# Patient Record
Sex: Female | Born: 1990 | Race: White | Hispanic: No | Marital: Married | Smoking: Former smoker
Health system: Southern US, Community
[De-identification: ages and names within clinical notes are randomized; demographics above are authoritative.]

## PROBLEM LIST (undated history)

## (undated) DIAGNOSIS — F988 Other specified behavioral and emotional disorders with onset usually occurring in childhood and adolescence: Secondary | ICD-10-CM

## (undated) DIAGNOSIS — F419 Anxiety disorder, unspecified: Secondary | ICD-10-CM

## (undated) DIAGNOSIS — O039 Complete or unspecified spontaneous abortion without complication: Secondary | ICD-10-CM

## (undated) DIAGNOSIS — Z8759 Personal history of other complications of pregnancy, childbirth and the puerperium: Secondary | ICD-10-CM

## (undated) HISTORY — PX: BREAST ENHANCEMENT SURGERY: SHX7

## (undated) HISTORY — DX: Other specified behavioral and emotional disorders with onset usually occurring in childhood and adolescence: F98.8

## (undated) HISTORY — DX: Complete or unspecified spontaneous abortion without complication: O03.9

## (undated) HISTORY — PX: WISDOM TOOTH EXTRACTION: SHX21

---
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
## 2013-01-20 DIAGNOSIS — F988 Other specified behavioral and emotional disorders with onset usually occurring in childhood and adolescence: Secondary | ICD-10-CM | POA: Insufficient documentation

## 2013-09-18 DIAGNOSIS — M21612 Bunion of left foot: Secondary | ICD-10-CM

## 2013-09-18 DIAGNOSIS — M21611 Bunion of right foot: Secondary | ICD-10-CM | POA: Insufficient documentation

## 2013-09-18 DIAGNOSIS — Z720 Tobacco use: Secondary | ICD-10-CM | POA: Insufficient documentation

## 2013-12-21 DIAGNOSIS — Z8041 Family history of malignant neoplasm of ovary: Secondary | ICD-10-CM | POA: Insufficient documentation

## 2014-03-09 DIAGNOSIS — F32A Depression, unspecified: Secondary | ICD-10-CM | POA: Insufficient documentation

## 2014-03-09 DIAGNOSIS — F419 Anxiety disorder, unspecified: Secondary | ICD-10-CM

## 2014-03-09 DIAGNOSIS — F329 Major depressive disorder, single episode, unspecified: Secondary | ICD-10-CM | POA: Insufficient documentation

## 2016-02-29 ENCOUNTER — Ambulatory Visit (INDEPENDENT_AMBULATORY_CARE_PROVIDER_SITE_OTHER): Payer: 59 | Admitting: Family Medicine

## 2016-02-29 ENCOUNTER — Encounter: Payer: Self-pay | Admitting: Family Medicine

## 2016-02-29 DIAGNOSIS — F988 Other specified behavioral and emotional disorders with onset usually occurring in childhood and adolescence: Secondary | ICD-10-CM

## 2016-02-29 MED ORDER — AMPHETAMINE-DEXTROAMPHET ER 15 MG PO CP24: 15.0000 mg | ORAL_CAPSULE | Freq: Every day | ORAL | 0 refills | Status: DC

## 2016-02-29 NOTE — Progress Notes (Signed)
Pre visit review using our clinic review tool, if applicable. No additional management support is needed unless otherwise documented below in the visit note. 

## 2016-02-29 NOTE — Patient Instructions (Signed)
Continue the medication.  You will need to see psychology in the near future before continued refills.  Take care  Dr. Adriana Simasook

## 2016-02-29 NOTE — Progress Notes (Signed)
Subjective:  Patient ID: Sherry GreenSamantha Bina, female    DOB: 08/13/1990  Age: 26 y.o. MRN: [id number]  CC: ADD  HPI Sherry GreenSamantha Voong is a 26 y.o. female presents to the clinic today regarding ADD.  ADD  Patient reports a prior history of ADD.  Diagnosed via PCP.  No prior psychological/psychiatric evaluation.  Reports difficulty in school as a child but no diagnosis or treatment until she was an adult.  She is currently about to run out of insurance, hence needs her medication.  She is currently on Adderall XR and doing well. Focus and concentration is good with this medicaiton.  No reported side effects.  No other complaints or issues at this time.  PMH, Surgical Hx, Family Hx, Social History reviewed and updated as below.  Past Medical History:  Diagnosis Date  . ADD (attention deficit disorder)    Past Surgical History:  Procedure Laterality Date  . BREAST ENHANCEMENT SURGERY    . WISDOM TOOTH EXTRACTION     Family History  Problem Relation Age of Onset  . Ovarian cancer Mother    Social History  Substance Use Topics  . Smoking status: Current Some Day Smoker    Types: E-cigarettes  . Smokeless tobacco: Never Used  . Alcohol use 1.2 - 1.8 oz/week    2 - 3 Standard drinks or equivalent per week   Review of Systems  Psychiatric/Behavioral: Positive for decreased concentration.  All other systems reviewed and are negative.  Objective:   Today's Vitals: BP 120/84   Pulse 92   Temp 98.3 F (36.8 C) (Oral)   Ht 5\' 2"  (1.575 m)   Wt 125 lb 6.4 oz (56.9 kg)   SpO2 100%   BMI 22.94 kg/m   Physical Exam  Constitutional: She is oriented to person, place, and time. She appears well-developed and well-nourished. No distress.  HENT:  Head: Normocephalic and atraumatic.  Nose: Nose normal.  Mouth/Throat: Oropharynx is clear and moist. No oropharyngeal exudate.  Normal TM's bilaterally.   Eyes: Conjunctivae are normal. No scleral icterus.  Neck: Neck supple.    Cardiovascular: Normal rate and regular rhythm.   No murmur heard. Pulmonary/Chest: Effort normal and breath sounds normal. She has no wheezes. She has no rales.  Abdominal: Soft. She exhibits no distension. There is no tenderness. There is no rebound and no guarding.  Musculoskeletal: Normal range of motion. She exhibits no edema.  Lymphadenopathy:    She has no cervical adenopathy.  Neurological: She is alert and oriented to person, place, and time.  Skin: Skin is warm and dry. No rash noted.  Psychiatric: She has a normal mood and affect.  Vitals reviewed.  Assessment & Plan:   Problem List Items Addressed This Visit    ADD (attention deficit disorder)    New problem (to me). Losing insurance this coming week. Has been stable on medication. No aberrancies/issues via [place] controlled substance database. Informed patient that I will continue in the short term until she can have a formal psychological eval to confirm. Patient in agreement. Adderall XR refilled today.         Outpatient Encounter Prescriptions as of 02/29/2016  Medication Sig  . amphetamine-dextroamphetamine (ADDERALL XR) 15 MG 24 hr capsule Take 1 capsule by mouth daily.  . [DISCONTINUED] amphetamine-dextroamphetamine (ADDERALL XR) 15 MG 24 hr capsule Take by mouth.   No facility-administered encounter medications on file as of 02/29/2016.     Follow-up: Pending insurance  Aaliyah Gavel [facility] DO [facility]  [employer]

## 2016-02-29 NOTE — Assessment & Plan Note (Signed)
New problem (to me). Losing insurance this coming week. Has been stable on medication. No aberrancies/issues via [place] controlled substance database. Informed patient that I will continue in the short term until she can have a formal psychological eval to confirm. Patient in agreement. Adderall XR refilled today.

## 2017-04-03 ENCOUNTER — Ambulatory Visit (INDEPENDENT_AMBULATORY_CARE_PROVIDER_SITE_OTHER): Payer: Medicaid Other

## 2017-04-03 ENCOUNTER — Ambulatory Visit: Payer: Medicaid Other | Admitting: Podiatry

## 2017-04-03 ENCOUNTER — Encounter: Payer: Self-pay | Admitting: Podiatry

## 2017-04-03 DIAGNOSIS — M216X1 Other acquired deformities of right foot: Secondary | ICD-10-CM | POA: Diagnosis not present

## 2017-04-03 DIAGNOSIS — M21619 Bunion of unspecified foot: Secondary | ICD-10-CM

## 2017-04-09 NOTE — Progress Notes (Signed)
   Subjective: 27 year old female presenting today as a new patient with a chief complaint of pain to the bilateral feet secondary to bunions that have been present for several years. She states the right foot is worse than the left. Wearing shoes exacerbates the pain. She has bot done anything for treatment. Patient is here for further evaluation and treatment.   Past Medical History:  Diagnosis Date  . ADD (attention deficit disorder)       Objective: Physical Exam General: The patient is alert and oriented x3 in no acute distress.  Dermatology: Skin is cool, dry and supple bilateral lower extremities. Negative for open lesions or macerations.  Vascular: Palpable pedal pulses bilaterally. No edema or erythema noted. Capillary refill within normal limits.  Neurological: Epicritic and protective threshold grossly intact bilaterally.   Musculoskeletal Exam: Clinical evidence of bunion deformity noted to the respective foot. There is a moderate pain on palpation range of motion of the first MPJ. Lateral deviation of the hallux noted consistent with hallux abductovalgus.  Radiographic Exam: Increased intermetatarsal angle greater than 15 with a hallux abductus angle greater than 30 noted on AP view. Moderate degenerative changes noted within the first MPJ.  Assessment: 1. HAV w/ bunion deformity bilateral     Plan of Care:  1. Patient was evaluated. X-Rays reviewed. 2. Today we discussed the conservative versus surgical management of bunion deformity. 3. Patient opts for conservative treatment at this time. She is getting married in October.  4. Recommended wide fitting shoes.  5. Return to clinic as needed when ready for surgery.   Has horses.    Felecia ShellingBrent M. Tanay Massiah, DPM [facility]  Dr. Felecia ShellingBrent M. Sharlena Kristensen, DPM    [address]                                        [place], [place] [address]                Office [phone]  Fax [phone]

## 2017-07-31 ENCOUNTER — Encounter: Payer: Self-pay | Admitting: Obstetrics and Gynecology

## 2017-07-31 ENCOUNTER — Other Ambulatory Visit: Payer: 59

## 2017-07-31 ENCOUNTER — Ambulatory Visit (INDEPENDENT_AMBULATORY_CARE_PROVIDER_SITE_OTHER): Payer: 59 | Admitting: Obstetrics and Gynecology

## 2017-07-31 VITALS — BP 124/80 | HR 89 | Ht 61.5 in | Wt 128.0 lb

## 2017-07-31 DIAGNOSIS — N938 Other specified abnormal uterine and vaginal bleeding: Secondary | ICD-10-CM

## 2017-07-31 DIAGNOSIS — Z30011 Encounter for initial prescription of contraceptive pills: Secondary | ICD-10-CM

## 2017-07-31 DIAGNOSIS — Z8759 Personal history of other complications of pregnancy, childbirth and the puerperium: Secondary | ICD-10-CM | POA: Diagnosis not present

## 2017-07-31 NOTE — Progress Notes (Signed)
Tommie Sams, DO   Chief Complaint  Patient presents with  . Menstrual Problem    X3 months randomly bleeding on and off, RLQ pain (poss endometriosis)    HPI:      Ms. Sherry Reynolds is a 27 y.o. G3P1 who LMP was No LMP recorded. (Menstrual status: Irregular Periods)., presents today for NP eval of irreg bleeding for the past 3 months. Pt's menses are usually monthly, last 5-6 days, no BTB, mild dysmen, improved with NSAIDs. Has RLQ midcycle pain with ovulation. Had u/s in past with "spots on ovaries suggestive of endometriosis". Pt's mom had endometriosis as well. No other pelvic pain usually.   Pt saw PCP for UTI sx about 3 months ago and had a pos UPT at that time. 2 wks later pt started bleeding for 2-3 wks. Since then, bleeding has been on and off, sometimes spotting only. She has random RLQ throbbing pains, usually lasting 20 min but lasted all day a few days ago. She has also had bloating and uterus "feels swollen". She has had a neg UPT since bleeding a couple months ago. No vag, urin, GI sx; no fevers.  She is sex active, not using BC. Had postcoital bleeding a couple wks ago that is unusual for pt. No new partners. Would like to restart OCPs once bleeding eval done. Did them in past without side effects. Had nexplanon but removed early due to irreg bleeding.   Had normal pap with PCP earlier this yr.   Past Medical History:  Diagnosis Date  . ADD (attention deficit disorder)   . Miscarriage    twice    Past Surgical History:  Procedure Laterality Date  . BREAST ENHANCEMENT SURGERY    . WISDOM TOOTH EXTRACTION      Family History  Problem Relation Age of Onset  . Endometriosis Mother     Social History   Socioeconomic History  . Marital status: Single    Spouse name: Not on file  . Number of children: Not on file  . Years of education: Not on file  . Highest education level: Not on file  Occupational History  . Not on file  Social Needs  . Financial  resource strain: Not on file  . Food insecurity:    Worry: Not on file    Inability: Not on file  . Transportation needs:    Medical: Not on file    Non-medical: Not on file  Tobacco Use  . Smoking status: Current Some Day Smoker    Types: E-cigarettes  . Smokeless tobacco: Never Used  Substance and Sexual Activity  . Alcohol use: Yes    Alcohol/week: 1.2 - 1.8 oz    Types: 2 - 3 Standard drinks or equivalent per week  . Drug use: No  . Sexual activity: Yes    Partners: Male    Birth control/protection: None  Lifestyle  . Physical activity:    Days per week: Not on file    Minutes per session: Not on file  . Stress: Not on file  Relationships  . Social connections:    Talks on phone: Not on file    Gets together: Not on file    Attends religious service: Not on file    Active member of club or organization: Not on file    Attends meetings of clubs or organizations: Not on file    Relationship status: Not on file  . Intimate partner violence:    Fear  of current or ex partner: Not on file    Emotionally abused: Not on file    Physically abused: Not on file    Forced sexual activity: Not on file  Other Topics Concern  . Not on file  Social History Narrative  . Not on file    Outpatient Medications Prior to Visit  Medication Sig Dispense Refill  . amphetamine-dextroamphetamine (ADDERALL XR) 15 MG 24 hr capsule Take 1 capsule by mouth daily. 30 capsule 0   No facility-administered medications prior to visit.     ROS:  Review of Systems  Constitutional: Positive for fatigue. Negative for fever and unexpected weight change.  Respiratory: Negative for cough, shortness of breath and wheezing.   Cardiovascular: Negative for chest pain, palpitations and leg swelling.  Gastrointestinal: Negative for blood in stool, constipation, diarrhea, nausea and vomiting.  Endocrine: Negative for cold intolerance, heat intolerance and polyuria.  Genitourinary: Positive for menstrual  problem and pelvic pain. Negative for dyspareunia, dysuria, flank pain, frequency, genital sores, hematuria, urgency, vaginal bleeding, vaginal discharge and vaginal pain.  Musculoskeletal: Negative for back pain, joint swelling and myalgias.  Skin: Negative for rash.  Neurological: Positive for headaches. Negative for dizziness, syncope, light-headedness and numbness.  Hematological: Negative for adenopathy.  Psychiatric/Behavioral: Negative for agitation, confusion, sleep disturbance and suicidal ideas. The patient is not nervous/anxious.   BREAST: No symptoms   OBJECTIVE:   Vitals:  BP 124/80   Pulse 89   Ht 5' 1.5" (1.562 m)   Wt 128 lb (58.1 kg)   BMI 23.79 kg/m   Physical Exam  Constitutional: She is oriented to person, place, and time. Vital signs are normal. She appears well-developed.  Pulmonary/Chest: Effort normal.  Abdominal: Soft. There is tenderness in the right lower quadrant. There is no rigidity and no guarding.  Genitourinary: Vagina normal. There is no rash, tenderness or lesion on the right labia. There is no rash, tenderness or lesion on the left labia. Uterus is not enlarged and not tender. Cervix exhibits no motion tenderness. Right adnexum displays tenderness. Right adnexum displays no mass. Left adnexum displays no mass and no tenderness. No erythema or tenderness in the vagina. No vaginal discharge found.  Musculoskeletal: Normal range of motion.  Neurological: She is alert and oriented to person, place, and time.  Psychiatric: She has a normal mood and affect. Her behavior is normal. Thought content normal.  Vitals reviewed.   Assessment/Plan: DUB (dysfunctional uterine bleeding) - Check serum beta HCG and GYN u/s. Will call with results.  - Plan: US PELVIS TRANSVANGINAL NON-OB (TV ONLY), Beta hCG quant (ref lab)  Miscarriage within last 12 months - Check labs and u/s.   Encounter for initial prescription of contraceptive pills - Will start once DUB sx  eval and managed.     Return today (on 07/31/2017) for GYN u/s at 4:30 today; ABC to call pt.  Alicia B. Copland, PA-C 08/08/2017 9:41 AM

## 2017-07-31 NOTE — Patient Instructions (Signed)
I value your feedback and entrusting us with your care. If you get a [facility] patient survey, I would appreciate you taking the time to let us know about your experience today. Thank you! 

## 2017-08-01 ENCOUNTER — Other Ambulatory Visit: Payer: 59

## 2017-08-01 LAB — BETA HCG QUANT (REF LAB): hCG Quant: <1 m[IU]/mL

## 2017-08-07 ENCOUNTER — Telehealth: Payer: Self-pay | Admitting: Obstetrics and Gynecology

## 2017-08-07 NOTE — Telephone Encounter (Signed)
Spoke with pt since hasn't had u/s yet for DUB. Pt states bleeding stopped last wk and hasn't had any more sx. Will follow cycles and f/u prn.

## 2017-08-08 ENCOUNTER — Encounter: Payer: Self-pay | Admitting: Obstetrics and Gynecology

## 2018-01-22 ENCOUNTER — Other Ambulatory Visit (HOSPITAL_COMMUNITY)
Admission: RE | Admit: 2018-01-22 | Discharge: 2018-01-22 | Disposition: A | Discharge status: Home / Self Care | Payer: Medicaid Other | Source: Ambulatory Visit | Attending: Obstetrics and Gynecology | Admitting: Obstetrics and Gynecology

## 2018-01-22 ENCOUNTER — Encounter: Payer: Self-pay | Admitting: Obstetrics and Gynecology

## 2018-01-22 ENCOUNTER — Ambulatory Visit (INDEPENDENT_AMBULATORY_CARE_PROVIDER_SITE_OTHER): Payer: 59 | Admitting: Obstetrics and Gynecology

## 2018-01-22 VITALS — BP 132/66 | Wt 129.0 lb

## 2018-01-22 DIAGNOSIS — Z3A01 Less than 8 weeks gestation of pregnancy: Secondary | ICD-10-CM

## 2018-01-22 DIAGNOSIS — F17210 Nicotine dependence, cigarettes, uncomplicated: Secondary | ICD-10-CM

## 2018-01-22 DIAGNOSIS — Z113 Encounter for screening for infections with a predominantly sexual mode of transmission: Secondary | ICD-10-CM

## 2018-01-22 DIAGNOSIS — N912 Amenorrhea, unspecified: Secondary | ICD-10-CM

## 2018-01-22 DIAGNOSIS — O99341 Other mental disorders complicating pregnancy, first trimester: Secondary | ICD-10-CM

## 2018-01-22 DIAGNOSIS — O99331 Smoking (tobacco) complicating pregnancy, first trimester: Secondary | ICD-10-CM

## 2018-01-22 DIAGNOSIS — F988 Other specified behavioral and emotional disorders with onset usually occurring in childhood and adolescence: Secondary | ICD-10-CM

## 2018-01-22 DIAGNOSIS — Z720 Tobacco use: Secondary | ICD-10-CM

## 2018-01-22 DIAGNOSIS — F329 Major depressive disorder, single episode, unspecified: Secondary | ICD-10-CM

## 2018-01-22 DIAGNOSIS — Z3480 Encounter for supervision of other normal pregnancy, unspecified trimester: Secondary | ICD-10-CM | POA: Insufficient documentation

## 2018-01-22 DIAGNOSIS — F419 Anxiety disorder, unspecified: Secondary | ICD-10-CM

## 2018-01-22 DIAGNOSIS — Z3201 Encounter for pregnancy test, result positive: Secondary | ICD-10-CM

## 2018-01-22 LAB — POCT URINALYSIS DIPSTICK OB
GLUCOSE, UA: NEGATIVE
POC,PROTEIN,UA: NEGATIVE

## 2018-01-22 LAB — POCT URINE PREGNANCY: Preg Test, Ur: POSITIVE — AB

## 2018-01-22 NOTE — Progress Notes (Signed)
New Obstetric Patient H&P   Chief Complaint: "Desires prenatal care"  History of Present Illness: Patient is a 28 y.o. 204P1021  female, LMP 12/10/2017 presents with amenorrhea and positive home pregnancy test. Based on her  LMP, her EDD is Estimated Date of Delivery: 09/16/18 and her EGA is 60101w1d. Cycles are 5. and 30. days, regular, and occur approximately every : 30 days. Her last pap smear was 10 months ago and was no abnormalities.    She had a urine pregnancy test which was positive 1 week(s)  ago. Her last menstrual period was normal and lasted for  3 or 4 day(s). Since her LMP she claims she has experienced no issues. She had vaginal spotting, ("very very light). Her past medical history is notable for ADHA. Her prior pregnancies are notable for no issues. With her two miscarriages she had them in the first trimester.   Since her LMP, she admits to the use of tobacco products yes. She is using a Vape to help her quit smoking.  She claims she has gained 4 pounds since the start of her pregnancy.  There are cats in the home in the home  no  She admits close contact with children on a regular basis  yes  She has had chicken pox in the past no She has had Tuberculosis exposures, symptoms, or previously tested positive for TB   no Current or past history of domestic violence. no  Genetic Screening/Teratology Counseling: (Includes patient, baby's father, or anyone in either family with:)   1. [facility]'s [facility]/= 3835 [facility]  no 2. Thalassemia ([place], [place], Mediterranean, or Asian background): MCV<80  no 3. Neural tube defect (meningomyelocele, spina bifida, anencephaly)  no 4. Congenital heart defect  no  5. Down syndrome  no 6. Tay-Sachs (Jewish, [place])  no 7. Canavan's Disease  no 8. Sickle cell disease or trait (African)  no  9. Hemophilia or other blood disorders  no  10. Muscular dystrophy  no  11. Cystic fibrosis  no  12. Huntington's Chorea  no  13. Mental  retardation/autism  no 14. Other inherited genetic or chromosomal disorder  no 15. Maternal metabolic disorder (DM, PKU, etc)  no 16. Patient or FOB with a child with a birth defect not listed above no  16a. Patient or FOB with a birth defect themselves no 17. Recurrent pregnancy loss, or stillbirth  no  18. Any medications since LMP other than prenatal vitamins (include vitamins, supplements, OTC meds, drugs, alcohol)  yes 19. Any other genetic/environmental exposure to discuss  no  Infection History:   1. Lives with someone with TB or TB exposed  no  2. Patient or partner has history of genital herpes  no 3. Rash or viral illness since LMP  no 4. History of STI (GC, CT, HPV, syphilis, HIV)  no 5. History of recent travel :  no  Other pertinent information: Has ADHD, taking Adderal    Review of Systems:10 point review of systems negative unless otherwise noted in HPI  Past Medical History:  Diagnosis Date  . ADD (attention deficit disorder)   . Miscarriage    twice   Past Surgical History:  Procedure Laterality Date  . BREAST ENHANCEMENT SURGERY    . WISDOM TOOTH EXTRACTION     Gynecologic History: Patient's last menstrual period was 12/10/2017 (exact date).  Obstetric History: G4P1021, svd x 1, no h/o of D&C  Family History  Problem Relation Age of Onset  . Endometriosis  Mother     Social History   Socioeconomic History  . Marital status: Single    Spouse name: Not on file  . Number of children: Not on file  . Years of education: Not on file  . Highest education level: Not on file  Occupational History  . Not on file  Social Needs  . Financial resource strain: Not on file  . Food insecurity:    Worry: Not on file    Inability: Not on file  . Transportation needs:    Medical: Not on file    Non-medical: Not on file  Tobacco Use  . Smoking status: Current Some Day Smoker    Types: E-cigarettes  . Smokeless tobacco: Never Used  Substance and Sexual  Activity  . Alcohol use: Yes    Alcohol/week: 2.0 - 3.0 standard drinks    Types: 2 - 3 Standard drinks or equivalent per week  . Drug use: No  . Sexual activity: Yes    Partners: Male    Birth control/protection: None  Lifestyle  . Physical activity:    Days per week: Not on file    Minutes per session: Not on file  . Stress: Not on file  Relationships  . Social connections:    Talks on phone: Not on file    Gets together: Not on file    Attends religious service: Not on file    Active member of club or organization: Not on file    Attends meetings of clubs or organizations: Not on file    Relationship status: Not on file  . Intimate partner violence:    Fear of current or ex partner: Not on file    Emotionally abused: Not on file    Physically abused: Not on file    Forced sexual activity: Not on file  Other Topics Concern  . Not on file  Social History Narrative  . Not on file   Allergies: No Known Allergies  Prior to Admission medications   Medication Sig Start Date End Date Taking? Authorizing Provider  amphetamine-dextroamphetamine (ADDERALL XR) 10 MG 24 hr capsule Adderall XR 10 mg capsule,extended release 12/24/17  Yes [provider]   Physical Exam BP 132/66   Wt 129 lb (58.5 kg)   LMP 12/10/2017 (Exact Date)   BMI 23.98 kg/m   Physical Exam Exam conducted with a chaperone present.  Constitutional:      General: She is not in acute distress.    Appearance: Normal appearance.  HENT:     Head: Normocephalic and atraumatic.  Eyes:     General: No scleral icterus.    Conjunctiva/sclera: Conjunctivae normal.  Neck:     Musculoskeletal: Normal range of motion and neck supple.  Cardiovascular:     Rate and Rhythm: Normal rate and regular rhythm.     Heart sounds: No murmur. No friction rub. No gallop.   Pulmonary:     Effort: Pulmonary effort is normal. No respiratory distress.     Breath sounds: Normal breath sounds. No wheezing, rhonchi or  rales.  Abdominal:     General: There is no distension.     Palpations: Abdomen is soft. There is no mass.     Tenderness: There is no abdominal tenderness. There is no guarding or rebound.     Hernia: There is no hernia in the right inguinal area or left inguinal area.  Genitourinary:    General: Normal vulva.     Exam position: Supine.  Pubic Area: No rash.      Labia:        Right: No rash, tenderness or lesion.        Left: No rash, tenderness or lesion.      Urethra: No prolapse or urethral lesion.     Vagina: Normal.     Cervix: Normal.     Uterus: Normal.      Adnexa: Right adnexa normal and left adnexa normal.  Musculoskeletal: Normal range of motion.        General: No swelling or tenderness.  Skin:    General: Skin is warm and dry.     Coloration: Skin is not jaundiced.  Neurological:     General: No focal deficit present.     Mental Status: She is alert and oriented to person, place, and time.     Cranial Nerves: No cranial nerve deficit.  Psychiatric:        Mood and Affect: Mood normal.        Behavior: Behavior normal.        Judgment: Judgment normal.     Female Chaperone present during breast and/or pelvic exam.   Assessment: 28 y.o. [id number] at [redacted]w[redacted]d presenting to initiate prenatal care  Plan: 1) Avoid alcoholic beverages. 2) Patient encouraged not to smoke.  3) Discontinue the use of all non-medicinal drugs and chemicals.  4) Take prenatal vitamins daily.  5) Nutrition, food safety (fish, cheese advisories, and high nitrite foods) and exercise discussed. 6) Hospital and practice style discussed with cross coverage system.  7) Genetic Screening, such as with 1st Trimester Screening, cell free fetal DNA, AFP testing, and Ultrasound, as well as with amniocentesis and CVS as appropriate, is discussed with patient. At the conclusion of today's visit patient undecided genetic testing 8) Patient is asked about travel to areas at risk for the [place] virus, and  counseled to avoid travel and exposure to mosquitoes or sexual partners who may have themselves been exposed to the virus. Testing is discussed, and will be ordered as appropriate.  9) ADHD: discussed possible risks of using Adderall. Recommendation for wean of medication with consideration of starting Wellbutrin.  She voiced that she would like to try come off all medication. 10) Nicotine use:  Recommend she stop vaping and no smoking.    Thomasene Mohair, MD 01/22/2018 2:50 PM

## 2018-01-22 NOTE — Patient Instructions (Signed)
First Trimester of Pregnancy  The first trimester of pregnancy is from week 1 until the end of week 13 (months 1 through 3). A week after a sperm fertilizes an egg, the egg will implant on the wall of the uterus. This embryo will begin to develop into a baby. Genes from you and your partner will form the baby. The female genes will determine whether the baby will be a boy or a girl. At 6-8 weeks, the eyes and face will be formed, and the heartbeat can be seen on ultrasound. At the end of 12 weeks, all the baby's organs will be formed.  Now that you are pregnant, you will want to do everything you can to have a healthy baby. Two of the most important things are to get good prenatal care and to follow your health care provider's instructions. Prenatal care is all the medical care you receive before the baby's birth. This care will help prevent, find, and treat any problems during the pregnancy and childbirth.  Body changes during your first trimester  Your body goes through many changes during pregnancy. The changes vary from woman to woman.   You may gain or lose a couple of pounds at first.   You may feel sick to your stomach (nauseous) and you may throw up (vomit). If the vomiting is uncontrollable, call your health care provider.   You may tire easily.   You may develop headaches that can be relieved by medicines. All medicines should be approved by your health care provider.   You may urinate more often. Painful urination may mean you have a bladder infection.   You may develop heartburn as a result of your pregnancy.   You may develop constipation because certain hormones are causing the muscles that push stool through your intestines to slow down.   You may develop hemorrhoids or swollen veins (varicose veins).   Your breasts may begin to grow larger and become tender. Your nipples may stick out more, and the tissue that surrounds them (areola) may become darker.   Your gums may bleed and may be  sensitive to brushing and flossing.   Dark spots or blotches (chloasma, mask of pregnancy) may develop on your face. This will likely fade after the baby is born.   Your menstrual periods will stop.   You may have a loss of appetite.   You may develop cravings for certain kinds of food.   You may have changes in your emotions from day to day, such as being excited to be pregnant or being concerned that something may go wrong with the pregnancy and baby.   You may have more vivid and strange dreams.   You may have changes in your hair. These can include thickening of your hair, rapid growth, and changes in texture. Some women also have hair loss during or after pregnancy, or hair that feels dry or thin. Your hair will most likely return to normal after your baby is born.  What to expect at prenatal visits  During a routine prenatal visit:   You will be weighed to make sure you and the baby are growing normally.   Your blood pressure will be taken.   Your abdomen will be measured to track your baby's growth.   The fetal heartbeat will be listened to between weeks 10 and 14 of your pregnancy.   Test results from any previous visits will be discussed.  Your health care provider may ask you:     How you are feeling.   If you are feeling the baby move.   If you have had any abnormal symptoms, such as leaking fluid, bleeding, severe headaches, or abdominal cramping.   If you are using any tobacco products, including cigarettes, chewing tobacco, and electronic cigarettes.   If you have any questions.  Other tests that may be performed during your first trimester include:   Blood tests to find your blood type and to check for the presence of any previous infections. The tests will also be used to check for low iron levels (anemia) and protein on red blood cells (Rh antibodies). Depending on your risk factors, or if you previously had diabetes during pregnancy, you may have tests to check for high blood sugar  that affects pregnant women (gestational diabetes).   Urine tests to check for infections, diabetes, or protein in the urine.   An ultrasound to confirm the proper growth and development of the baby.   Fetal screens for spinal cord problems (spina bifida) and Down syndrome.   HIV (human immunodeficiency virus) testing. Routine prenatal testing includes screening for HIV, unless you choose not to have this test.   You may need other tests to make sure you and the baby are doing well.  Follow these instructions at home:  Medicines   Follow your health care provider's instructions regarding medicine use. Specific medicines may be either safe or unsafe to take during pregnancy.   Take a prenatal vitamin that contains at least 600 micrograms (mcg) of folic acid.   If you develop constipation, try taking a stool softener if your health care provider approves.  Eating and drinking     Eat a balanced diet that includes fresh fruits and vegetables, whole grains, good sources of protein such as meat, eggs, or tofu, and low-fat dairy. Your health care provider will help you determine the amount of weight gain that is right for you.   Avoid raw meat and uncooked cheese. These carry germs that can cause birth defects in the baby.   Eating four or five small meals rather than three large meals a day may help relieve nausea and vomiting. If you start to feel nauseous, eating a few soda crackers can be helpful. Drinking liquids between meals, instead of during meals, also seems to help ease nausea and vomiting.   Limit foods that are high in fat and processed sugars, such as fried and sweet foods.   To prevent constipation:  ? Eat foods that are high in fiber, such as fresh fruits and vegetables, whole grains, and beans.  ? Drink enough fluid to keep your urine clear or pale yellow.  Activity   Exercise only as directed by your health care provider. Most women can continue their usual exercise routine during  pregnancy. Try to exercise for 30 minutes at least 5 days a week. Exercising will help you:  ? Control your weight.  ? Stay in shape.  ? Be prepared for labor and delivery.   Experiencing pain or cramping in the lower abdomen or lower back is a good sign that you should stop exercising. Check with your health care provider before continuing with normal exercises.   Try to avoid standing for long periods of time. Move your legs often if you must stand in one place for a long time.   Avoid heavy lifting.   Wear low-heeled shoes and practice good posture.   You may continue to have sex unless your health care   provider tells you not to.  Relieving pain and discomfort   Wear a good support bra to relieve breast tenderness.   Take warm sitz baths to soothe any pain or discomfort caused by hemorrhoids. Use hemorrhoid cream if your health care provider approves.   Rest with your legs elevated if you have leg cramps or low back pain.   If you develop varicose veins in your legs, wear support hose. Elevate your feet for 15 minutes, 3-4 times a day. Limit salt in your diet.  Prenatal care   Schedule your prenatal visits by the twelfth week of pregnancy. They are usually scheduled monthly at first, then more often in the last 2 months before delivery.   Write down your questions. Take them to your prenatal visits.   Keep all your prenatal visits as told by your health care provider. This is important.  Safety   Wear your seat belt at all times when driving.   Make a list of emergency phone numbers, including numbers for family, friends, the hospital, and police and fire departments.  General instructions   Ask your health care provider for a referral to a local prenatal education class. Begin classes no later than the beginning of month 6 of your pregnancy.   Ask for help if you have counseling or nutritional needs during pregnancy. Your health care provider can offer advice or refer you to specialists for help  with various needs.   Do not use hot tubs, steam rooms, or saunas.   Do not douche or use tampons or scented sanitary pads.   Do not cross your legs for long periods of time.   Avoid cat litter boxes and soil used by cats. These carry germs that can cause birth defects in the baby and possibly loss of the fetus by miscarriage or stillbirth.   Avoid all smoking, herbs, alcohol, and medicines not prescribed by your health care provider. Chemicals in these products affect the formation and growth of the baby.   Do not use any products that contain nicotine or tobacco, such as cigarettes and e-cigarettes. If you need help quitting, ask your health care provider. You may receive counseling support and other resources to help you quit.   Schedule a dentist appointment. At home, brush your teeth with a soft toothbrush and be gentle when you floss.  Contact a health care provider if:   You have dizziness.   You have mild pelvic cramps, pelvic pressure, or nagging pain in the abdominal area.   You have persistent nausea, vomiting, or diarrhea.   You have a bad smelling vaginal discharge.   You have pain when you urinate.   You notice increased swelling in your face, hands, legs, or ankles.   You are exposed to fifth disease or chickenpox.   You are exposed to German measles (rubella) and have never had it.  Get help right away if:   You have a fever.   You are leaking fluid from your vagina.   You have spotting or bleeding from your vagina.   You have severe abdominal cramping or pain.   You have rapid weight gain or loss.   You vomit blood or material that looks like coffee grounds.   You develop a severe headache.   You have shortness of breath.   You have any kind of trauma, such as from a fall or a car accident.  Summary   The first trimester of pregnancy is from week 1 until   the end of week 13 (months 1 through 3).   Your body goes through many changes during pregnancy. The changes vary from  woman to woman.   You will have routine prenatal visits. During those visits, your health care provider will examine you, discuss any test results you may have, and talk with you about how you are feeling.  This information is not intended to replace advice given to you by your health care provider. Make sure you discuss any questions you have with your health care provider.  Document Released: 12/19/2000 Document Revised: 12/07/2015 Document Reviewed: 12/07/2015  Elsevier Interactive Patient Education  2019 [employer]

## 2018-01-23 LAB — URINE DRUG PANEL 7
Amphetamines, Urine: NEGATIVE ng/mL
Barbiturate Quant, Ur: NEGATIVE ng/mL
Benzodiazepine Quant, Ur: NEGATIVE ng/mL
Cannabinoid Quant, Ur: NEGATIVE ng/mL
Cocaine (Metab.): NEGATIVE ng/mL
Opiate Quant, Ur: NEGATIVE ng/mL
PCP Quant, Ur: NEGATIVE ng/mL

## 2018-01-24 ENCOUNTER — Telehealth: Payer: Self-pay

## 2018-01-24 ENCOUNTER — Other Ambulatory Visit: Payer: Self-pay | Admitting: Obstetrics and Gynecology

## 2018-01-24 DIAGNOSIS — Z3480 Encounter for supervision of other normal pregnancy, unspecified trimester: Secondary | ICD-10-CM

## 2018-01-24 DIAGNOSIS — F988 Other specified behavioral and emotional disorders with onset usually occurring in childhood and adolescence: Secondary | ICD-10-CM

## 2018-01-24 LAB — URINE CULTURE: Organism ID, Bacteria: NO GROWTH

## 2018-01-24 LAB — CERVICOVAGINAL ANCILLARY ONLY
Chlamydia: NEGATIVE
Neisseria Gonorrhea: NEGATIVE

## 2018-01-24 MED ORDER — AMPHETAMINE-DEXTROAMPHET ER 5 MG PO CP24: 5.0000 mg | ORAL_CAPSULE | Freq: Every day | ORAL | 0 refills | Status: DC

## 2018-01-24 NOTE — Telephone Encounter (Signed)
Pt called back. SDJ please call her after clinicals today.

## 2018-01-24 NOTE — Telephone Encounter (Signed)
Discussed wean of medication with patient. Plan is to have the patient stop medication altogether. However, she is concerned about stopping at 10 mg with no wean. We discussed that I have little-to-no experience in weaning this medication. However, if her PCP was not willing to provider her with a wean, then I would give her 5 mg for 7-10 days. She runs out of 10 mg medication in 2 days. So, she will need something prior to the weekend.

## 2018-01-24 NOTE — Telephone Encounter (Signed)
Pt calling states is trying to get rx from PCP but PCP won't rx it without seeing her which isn't going to happen at 4:30 Fri pm.  Would SDJ be able to rs med talked about.  Please call.  [phone]

## 2018-01-24 NOTE — Telephone Encounter (Signed)
She is unable to get medication prior to weekend. Plan is to wean down over 10 days. Will send in 10-day rx for wean.

## 2018-01-24 NOTE — Telephone Encounter (Signed)
Discussed wean of medication with patient. Plan is to have the patient stop medication altogether. However, she is concerned about stopping at 10 mg with no wean. We discussed that I have little-to-no experience in weaning this medication. However, if her PCP was not willing to provider her with a wean, then I would give her 5 mg for 7-10 days. She runs out of 10 mg medication in 2 days. So, she will need something prior to the weekend. °

## 2018-01-24 NOTE — Telephone Encounter (Signed)
Pt called her PCP for refill of medication you talked about at her last visit with a lower dose.  Her primary, Dr Jose Persia, wants to discuss pt's medication with you.  Pt's [phone]

## 2018-01-30 ENCOUNTER — Other Ambulatory Visit: Payer: Self-pay | Admitting: Obstetrics and Gynecology

## 2018-01-30 ENCOUNTER — Ambulatory Visit (INDEPENDENT_AMBULATORY_CARE_PROVIDER_SITE_OTHER): Payer: Medicaid Other

## 2018-01-30 ENCOUNTER — Ambulatory Visit (INDEPENDENT_AMBULATORY_CARE_PROVIDER_SITE_OTHER): Payer: Self-pay | Admitting: Obstetrics and Gynecology

## 2018-01-30 ENCOUNTER — Encounter: Payer: Self-pay | Admitting: Obstetrics and Gynecology

## 2018-01-30 VITALS — BP 128/74 | Wt 130.0 lb

## 2018-01-30 DIAGNOSIS — Z3480 Encounter for supervision of other normal pregnancy, unspecified trimester: Secondary | ICD-10-CM

## 2018-01-30 DIAGNOSIS — F32A Depression, unspecified: Secondary | ICD-10-CM

## 2018-01-30 DIAGNOSIS — Z3687 Encounter for antenatal screening for uncertain dates: Secondary | ICD-10-CM | POA: Diagnosis not present

## 2018-01-30 DIAGNOSIS — F329 Major depressive disorder, single episode, unspecified: Secondary | ICD-10-CM

## 2018-01-30 DIAGNOSIS — F419 Anxiety disorder, unspecified: Secondary | ICD-10-CM

## 2018-01-30 DIAGNOSIS — Z3A01 Less than 8 weeks gestation of pregnancy: Secondary | ICD-10-CM

## 2018-01-30 DIAGNOSIS — O99341 Other mental disorders complicating pregnancy, first trimester: Secondary | ICD-10-CM

## 2018-01-30 NOTE — Progress Notes (Signed)
Routine Prenatal Care Visit  Subjective  Sherry Reynolds is a 28 y.o. G4P1021 at 2590w2d being seen today for ongoing prenatal care.  She is currently monitored for the following issues for this high-risk pregnancy and has ADD (attention deficit disorder); Anxiety and depression; Bilateral bunions; Family history of ovarian cancer; Tobacco use; Other specified behavioral and emotional disorders with onset usually occurring in childhood and adolescence; and Supervision of other normal pregnancy, antepartum on their problem list.  ----------------------------------------------------------------------------------- Patient reports no complaints.    . Vag. Bleeding: None.   . Denies leaking of fluid.  Dating/viability ultrasound shows single gestational sac with a yolk sac with no fetal pole. No abnormalities noted. The gestational sac measured 9.9 in mean sac diameter, consistent with a 4462w5d gestation. ----------------------------------------------------------------------------------- The following portions of the patient's history were reviewed and updated as appropriate: allergies, current medications, past family history, past medical history, past social history, past surgical history and problem list. Problem list updated.   Objective  Blood pressure 128/74, weight 130 lb (59 kg), last menstrual period 12/10/2017. Pregravid weight 129 lb (58.5 kg) Total Weight Gain 1 lb (0.454 kg) Urinalysis: Urine Protein    Urine Glucose    Fetal Status:           General:  Alert, oriented and cooperative. Patient is in no acute distress.  Skin: Skin is warm and dry. No rash noted.   Cardiovascular: Normal heart rate noted  Respiratory: Normal respiratory effort, no problems with respiration noted  Abdomen: Soft, gravid, appropriate for gestational age.       Pelvic:  Cervical exam deferred        Extremities: Normal range of motion.     Mental Status: Normal mood and affect. Normal behavior. Normal  judgment and thought content.   Imaging Results [place] Ob Transvaginal  Result Date: 01/30/2018 Patient Name: Sherry Reynolds DOB: September 17, 1990 MRN: [id number] ULTRASOUND REPORT Location: [facility] Date of Service: 01/30/2018 Indications:dating Findings: Intrauterine gestational sac measuring 9.439mm, 3362w5d. Yolk sac seen (4.3 mm), but no fetal pole seen due to early gestational age. Right Ovary is normal in appearance. Left Ovary is normal appearance. Survey of the adnexa demonstrates no adnexal masses. There is no free peritoneal fluid in the cul de sac. Impression: 1. Intrauterine gestational sac measuring 3262w5d. This does not correlate with the LMP EDD. 2. Recommendations: 1.Clinical correlation with the patient's History and Physical Exam. 2. Follow up viability in 11 or more days. Darlina Guys* Abby M Clarke, RDMS RVT *Criteria from the Society of Radiologists in Ultrasound Multispecialty Consensus Conference on Early First Trimester Diagnosis of Miscarriage and Exclusion of a Viable Intrauterine Pregnancy, October 2012. The ultrasound images and findings were reviewed by me and I agree with the above report. Thomasene Mohair , MD, Merlinda FrederickFACOG [facility], [facility] 01/30/2018 11:52 AM        Assessment   27 y.o. [id number] at 3190w2d by  09/16/2018, by Last Menstrual Period presenting for routine prenatal visit  Plan   Pregnancy#4 Problems (from 12/10/17 to present)    Problem Noted Resolved   Supervision of other normal pregnancy, antepartum 01/22/2018 by Conard Novak,  D, MD No       Preterm labor symptoms and general obstetric precautions including but not limited to vaginal bleeding, contractions, leaking of fluid and fetal movement were reviewed in detail with the patient. Please refer to After Visit Summary for other counseling recommendations.   Discussed u/s findings.  Likely earlier than thought. But, will repeat ultrasound  in 11+ days to verify viability and get specific dating.  Patient  encouraged to report any abnormal symptoms in the mean time.  Return in about 11 days (around 02/10/2018) for u/s for dating/viability and routine prenatal with Dr. Jean Rosenthal.  Thomasene MohairStephen , MD, Merlinda FrederickFACOG [facility], [facility] 01/30/2018 3:27 PM

## 2018-01-31 ENCOUNTER — Encounter: Payer: 59 | Admitting: Advanced Practice Midwife

## 2018-02-10 ENCOUNTER — Ambulatory Visit (INDEPENDENT_AMBULATORY_CARE_PROVIDER_SITE_OTHER): Payer: Medicaid Other | Admitting: Obstetrics and Gynecology

## 2018-02-10 ENCOUNTER — Other Ambulatory Visit: Payer: Self-pay | Admitting: Obstetrics and Gynecology

## 2018-02-10 ENCOUNTER — Ambulatory Visit (INDEPENDENT_AMBULATORY_CARE_PROVIDER_SITE_OTHER): Payer: Medicaid Other

## 2018-02-10 VITALS — BP 124/74 | Wt 130.0 lb

## 2018-02-10 DIAGNOSIS — Z3480 Encounter for supervision of other normal pregnancy, unspecified trimester: Secondary | ICD-10-CM

## 2018-02-10 DIAGNOSIS — Z3A09 9 weeks gestation of pregnancy: Secondary | ICD-10-CM

## 2018-02-10 DIAGNOSIS — Z3A08 8 weeks gestation of pregnancy: Secondary | ICD-10-CM

## 2018-02-10 DIAGNOSIS — F419 Anxiety disorder, unspecified: Secondary | ICD-10-CM

## 2018-02-10 DIAGNOSIS — Z3A01 Less than 8 weeks gestation of pregnancy: Secondary | ICD-10-CM

## 2018-02-10 DIAGNOSIS — F329 Major depressive disorder, single episode, unspecified: Secondary | ICD-10-CM

## 2018-02-10 DIAGNOSIS — O99341 Other mental disorders complicating pregnancy, first trimester: Secondary | ICD-10-CM

## 2018-02-10 DIAGNOSIS — O3680X0 Pregnancy with inconclusive fetal viability, not applicable or unspecified: Secondary | ICD-10-CM

## 2018-02-10 NOTE — Progress Notes (Deleted)
Routine Prenatal Care Visit  Subjective  Sherry Reynolds is a 28 y.o. G4P1021 at [redacted]w[redacted]d being seen today for ongoing prenatal care.  She is currently monitored for the following issues for this high-risk pregnancy and has ADD (attention deficit disorder); Anxiety and depression; Bilateral bunions; Family history of ovarian cancer; Tobacco use; Other specified behavioral and emotional disorders with onset usually occurring in childhood and adolescence; and Supervision of other normal pregnancy, antepartum on their problem list.  ----------------------------------------------------------------------------------- Patient reports no complaints.    . Vag. Bleeding: None.   . Denies leaking of fluid.  U/S shows no viable intrauterine pregnancy. However, it shows a large sac containing 2 to possibly three smaller sacs.  It does not necessarily have the classic "cluster of grapes" appearance, either.  No abnormal cysts on the ovaries.   ----------------------------------------------------------------------------------- The following portions of the patient's history were reviewed and updated as appropriate: allergies, current medications, past family history, past medical history, past social history, past surgical history and problem list. Problem list updated.   Objective  Blood pressure 124/74, weight 130 lb (59 kg), last menstrual period 12/10/2017. Pregravid weight 129 lb (58.5 kg) Total Weight Gain 1 lb (0.454 kg) Urinalysis: Urine Protein    Urine Glucose    Fetal Status:           General:  Alert, oriented and cooperative. Patient is in no acute distress.  Skin: Skin is warm and dry. No rash noted.   Cardiovascular: Normal heart rate noted  Respiratory: Normal respiratory effort, no problems with respiration noted  Abdomen: Soft, gravid, appropriate for gestational age. Pain/Pressure: Absent     Pelvic:  Cervical exam deferred        Extremities: Normal range of motion.     Mental Status:  Normal mood and affect. Normal behavior. Normal judgment and thought content.   Imaging Results: Us Ob Less Than 14 Weeks With Ob Transvaginal  Result Date: 02/10/2018 ULTRASOUND REPORT Location: [facility] Date of Service: 02/10/2018 Patient Name: Sherry Reynolds DOB: 12/26/1990 MRN: [id number] Indications:dating Findings: Single Gestational sac  without fetal pole is seen FHR: NA Possible one yolk sac and  one amnion without fetal pole (early embryo death) vs two yolk sac (twin pregnancy) vs early stage of pregnancy. Right Ovary is normal in appearance. Left Ovary is normal appearance. Corpus luteal cyst:  is not visualized Survey of the adnexa demonstrates no adnexal masses. There is no free peritoneal fluid in the cul de sac. Impression: 1.Possible one yolk sac and one amnion without fetal pole (early embryo death) vs two yolk sacs (twin pregnancy) vs early stage of pregnancy. Recommendations: 1.Clinical correlation with the patient's History and Physical Exam. 2. Need  follow up ultrasound. Mital bahen P Patel The ultrasound images and findings were reviewed by me and I agree with the above report. Stephen Jackson, MD, FACOG [facility], [facility] 02/10/2018 2:07 PM       Assessment   28 y.o. G4P1021 at [redacted]w[redacted]d by  09/16/2018, by Last Menstrual Period presenting for routine prenatal visit  Plan   Pregnancy#4 Problems (from 12/10/17 to present)    Problem Noted Resolved   Supervision of other normal pregnancy, antepartum 01/22/2018 by Jackson, Stephen D, MD No       Preterm labor symptoms and general obstetric precautions including but not limited to vaginal bleeding, contractions, leaking of fluid and fetal movement were reviewed in detail with the patient. Please refer to After Visit Summary for other counseling recommendations.   -   Discussed ultrasound results.  Technically, this could qualify as an early pregnancy loss given the criteria discussed in ACOG PB 200 ("Early  Pregnancy Loss").  However, given possibly two yolk sacs (?3), I would like to obtain more information to give her a better diagnosis.  Differential does include: failed early pregnancy, multiple gestation (with a possible failure of one or more of the gestations), less likely would be molar pregnancy. Will obtain and trend the beta hCG levels and will get a progesterone level to assess pregnancy status. All this discussed in great detail with the patient.  Discussed that I could not give her a specific diagnosis today. However, I am concerned for an early pregnancy loss. But, we should be absolutely sure before making theses kinds of diagnoses, as this is a very desired pregnancy. She voiced understanding and agreement with the plan.  Follow up will be based on lab results.  Repeat pelvic ultrasound in probably a week to 1.5 weeks.   Return if symptoms worsen or fail to improve.  Stephen Jackson, MD, FACOG [facility], [facility] 02/11/2018 11:20 AM    

## 2018-02-11 ENCOUNTER — Telehealth: Payer: Self-pay | Admitting: Obstetrics and Gynecology

## 2018-02-11 ENCOUNTER — Encounter: Payer: Self-pay | Admitting: Obstetrics and Gynecology

## 2018-02-11 ENCOUNTER — Other Ambulatory Visit: Payer: Self-pay | Admitting: Obstetrics and Gynecology

## 2018-02-11 DIAGNOSIS — Z3480 Encounter for supervision of other normal pregnancy, unspecified trimester: Secondary | ICD-10-CM

## 2018-02-11 LAB — BETA HCG QUANT (REF LAB): hCG Quant: 14254 m[IU]/mL

## 2018-02-11 LAB — PROGESTERONE: Progesterone: 17.7 ng/mL

## 2018-02-11 NOTE — Telephone Encounter (Signed)
Discussed progesterone and hCG levels. Progesterone level is indeterminate.  The hCG level is the first and will trend it again tomorrow. She will call to schedule a lab appointment.  She has no issues today (no bleeding, cramping, etc.).

## 2018-02-12 ENCOUNTER — Other Ambulatory Visit: Payer: Medicaid Other

## 2018-02-12 DIAGNOSIS — Z3480 Encounter for supervision of other normal pregnancy, unspecified trimester: Secondary | ICD-10-CM | POA: Diagnosis not present

## 2018-02-13 ENCOUNTER — Telehealth: Payer: Self-pay | Admitting: Obstetrics and Gynecology

## 2018-02-13 ENCOUNTER — Other Ambulatory Visit: Payer: Self-pay | Admitting: Obstetrics and Gynecology

## 2018-02-13 DIAGNOSIS — Z3A01 Less than 8 weeks gestation of pregnancy: Secondary | ICD-10-CM

## 2018-02-13 DIAGNOSIS — Z3480 Encounter for supervision of other normal pregnancy, unspecified trimester: Secondary | ICD-10-CM

## 2018-02-13 LAB — BETA HCG QUANT (REF LAB): hCG Quant: 12845 m[IU]/mL

## 2018-02-13 NOTE — Progress Notes (Signed)
Routine Prenatal Care Visit  Subjective  Sherry Reynolds is a 28 y.o. G4P1021 at [redacted]w[redacted]d being seen today for ongoing prenatal care.  She is currently monitored for the following issues for this high-risk pregnancy and has ADD (attention deficit disorder); Anxiety and depression; Bilateral bunions; Family history of ovarian cancer; Tobacco use; Other specified behavioral and emotional disorders with onset usually occurring in childhood and adolescence; and Supervision of other normal pregnancy, antepartum on their problem list.  ----------------------------------------------------------------------------------- Patient reports no complaints.    . Vag. Bleeding: None.   . Denies leaking of fluid.  U/S shows no viable intrauterine pregnancy. However, it shows a large sac containing 2 to possibly three smaller sacs.  It does not necessarily have the classic "cluster of grapes" appearance, either.  No abnormal cysts on the ovaries.   ----------------------------------------------------------------------------------- The following portions of the patient's history were reviewed and updated as appropriate: allergies, current medications, past family history, past medical history, past social history, past surgical history and problem list. Problem list updated.   Objective  Blood pressure 124/74, weight 130 lb (59 kg), last menstrual period 12/10/2017. Pregravid weight 129 lb (58.5 kg) Total Weight Gain 1 lb (0.454 kg) Urinalysis: Urine Protein    Urine Glucose    Fetal Status:           General:  Alert, oriented and cooperative. Patient is in no acute distress.  Skin: Skin is warm and dry. No rash noted.   Cardiovascular: Normal heart rate noted  Respiratory: Normal respiratory effort, no problems with respiration noted  Abdomen: Soft, gravid, appropriate for gestational age. Pain/Pressure: Absent     Pelvic:  Cervical exam deferred        Extremities: Normal range of motion.     Mental Status:  Normal mood and affect. Normal behavior. Normal judgment and thought content.   Imaging Results: US Ob Less Than 14 Weeks With Ob Transvaginal  Result Date: 02/10/2018 ULTRASOUND REPORT Location: [facility] Date of Service: 02/10/2018 Patient Name: Phelan Nitsch DOB: [birth year] MRN: [id number] Indications:dating Findings: Single Gestational sac  without fetal pole is seen FHR: NA Possible one yolk sac and  one amnion without fetal pole (early embryo death) vs two yolk sac (twin pregnancy) vs early stage of pregnancy. Right Ovary is normal in appearance. Left Ovary is normal appearance. Corpus luteal cyst:  is not visualized Survey of the adnexa demonstrates no adnexal masses. There is no free peritoneal fluid in the cul de sac. Impression: 1.Possible one yolk sac and one amnion without fetal pole (early embryo death) vs two yolk sacs (twin pregnancy) vs early stage of pregnancy. Recommendations: 1.Clinical correlation with the patient's History and Physical Exam. 2. Need  follow up ultrasound. Mital bahen P Patel The ultrasound images and findings were reviewed by me and I agree with the above report. Thomasene Mohair, MD, [facility], [facility] 02/10/2018 2:07 PM       Assessment   27 y.o. [id number] at [redacted]w[redacted]d by  09/16/2018, by Last Menstrual Period presenting for routine prenatal visit  Plan   Pregnancy#4 Problems (from 12/10/17 to present)    Problem Noted Resolved   Supervision of other normal pregnancy, antepartum 01/22/2018 by Conard Novak, MD No       Preterm labor symptoms and general obstetric precautions including but not limited to vaginal bleeding, contractions, leaking of fluid and fetal movement were reviewed in detail with the patient. Please refer to After Visit Summary for other counseling recommendations.   -  Discussed ultrasound results.  Technically, this could qualify as an early pregnancy loss given the criteria discussed in ACOG PB 200 ("Early  Pregnancy Loss").  However, given possibly two yolk sacs (?3), I would like to obtain more information to give her a better diagnosis.  Differential does include: failed early pregnancy, multiple gestation (with a possible failure of one or more of the gestations), less likely would be molar pregnancy. Will obtain and trend the beta hCG levels and will get a progesterone level to assess pregnancy status. All this discussed in great detail with the patient.  Discussed that I could not give her a specific diagnosis today. However, I am concerned for an early pregnancy loss. But, we should be absolutely sure before making theses kinds of diagnoses, as this is a very desired pregnancy. She voiced understanding and agreement with the plan.  Follow up will be based on lab results.  Repeat pelvic ultrasound in probably a week to 1.5 weeks.   Return if symptoms worsen or fail to improve.  Thomasene MohairStephen Nathen Balaban, MD, [facility]linda FrederickFACOG [facility], [facility] 02/11/2018 11:20 AM

## 2018-02-13 NOTE — Telephone Encounter (Signed)
Discussed small drop in hCG. Disucssed (based on ultrasound findings) that this could represent a failed pregnancy vs a vanishing twin. Plan is to continue trending hCG levels. If they continue to fall, then higher concern for failed pregnancy. If they start to increase, perhaps this would be an indicator of vanishing twin.   Will get an ultrasound next week for follow up.

## 2018-02-14 ENCOUNTER — Other Ambulatory Visit: Payer: Medicaid Other

## 2018-02-14 DIAGNOSIS — Z3480 Encounter for supervision of other normal pregnancy, unspecified trimester: Secondary | ICD-10-CM | POA: Diagnosis not present

## 2018-02-14 DIAGNOSIS — Z3A01 Less than 8 weeks gestation of pregnancy: Secondary | ICD-10-CM | POA: Diagnosis not present

## 2018-02-15 ENCOUNTER — Telehealth: Payer: Self-pay | Admitting: Obstetrics and Gynecology

## 2018-02-15 LAB — BETA HCG QUANT (REF LAB): hCG Quant: 12313 m[IU]/mL

## 2018-02-15 NOTE — Telephone Encounter (Signed)
Discussed slight drop in hCG.  Discussed my suspicion that this was not a normally-progressing pregnancy.  Her preference is to wait for the ultrasound scheduled for 2/12.  Will obtain this u/s and make decisions from there, as indicated.  She has no current symptoms.   Will get ABO/RH next visit.

## 2018-02-19 ENCOUNTER — Other Ambulatory Visit: Payer: Self-pay | Admitting: Obstetrics and Gynecology

## 2018-02-19 ENCOUNTER — Ambulatory Visit (INDEPENDENT_AMBULATORY_CARE_PROVIDER_SITE_OTHER): Payer: Medicaid Other

## 2018-02-19 ENCOUNTER — Encounter: Payer: Self-pay | Admitting: Obstetrics and Gynecology

## 2018-02-19 ENCOUNTER — Ambulatory Visit (INDEPENDENT_AMBULATORY_CARE_PROVIDER_SITE_OTHER): Payer: Medicaid Other | Admitting: Obstetrics and Gynecology

## 2018-02-19 VITALS — BP 110/66 | Wt 130.0 lb

## 2018-02-19 DIAGNOSIS — O3680X0 Pregnancy with inconclusive fetal viability, not applicable or unspecified: Secondary | ICD-10-CM

## 2018-02-19 DIAGNOSIS — Z3480 Encounter for supervision of other normal pregnancy, unspecified trimester: Secondary | ICD-10-CM

## 2018-02-19 DIAGNOSIS — Z3A01 Less than 8 weeks gestation of pregnancy: Secondary | ICD-10-CM | POA: Diagnosis not present

## 2018-02-19 DIAGNOSIS — Z3481 Encounter for supervision of other normal pregnancy, first trimester: Secondary | ICD-10-CM

## 2018-02-19 NOTE — Progress Notes (Signed)
Gynecology Ultrasound Follow Up   Chief Complaint  Patient presents with  . Follow-up    OB Ultrasound  possible anembryonic pregnancy  History of Present Illness: Patient is a 28 y.o. female who presents today for ultrasound evaluation of the above .  Ultrasound demonstrates the following findings Adnexa: no masses seen  Uterus: IUP noted with gestational sac measuring 16.8 mm (~14 mm on last ultrasound 2/3, ~10 mm on 1/23) Additional: three circle-like structures (?yolk sac) with another larger sac with no fetal pole noted.  Over three weeks have passed the original ultrasound showing a gestational sac with a yolk sac and there is still no fetal pole.  Her beta hCG values did trend down slightly, as well.   Past Medical History:  Diagnosis Date  . ADD (attention deficit disorder)   . Miscarriage    twice    Past Surgical History:  Procedure Laterality Date  . BREAST ENHANCEMENT SURGERY    . WISDOM TOOTH EXTRACTION      Family History  Problem Relation Age of Onset  . Endometriosis Mother     Social History   Socioeconomic History  . Marital status: Single    Spouse name: Not on file  . Number of children: Not on file  . Years of education: Not on file  . Highest education level: Not on file  Occupational History  . Not on file  Social Needs  . Financial resource strain: Not on file  . Food insecurity:    Worry: Not on file    Inability: Not on file  . Transportation needs:    Medical: Not on file    Non-medical: Not on file  Tobacco Use  . Smoking status: Current Some Day Smoker    Types: E-cigarettes  . Smokeless tobacco: Never Used  Substance and Sexual Activity  . Alcohol use: Yes    Alcohol/week: 2.0 - 3.0 standard drinks    Types: 2 - 3 Standard drinks or equivalent per week  . Drug use: No  . Sexual activity: Yes    Partners: Male    Birth control/protection: None  Lifestyle  . Physical activity:    Days per week: Not on file    Minutes  per session: Not on file  . Stress: Not on file  Relationships  . Social connections:    Talks on phone: Not on file    Gets together: Not on file    Attends religious service: Not on file    Active member of club or organization: Not on file    Attends meetings of clubs or organizations: Not on file    Relationship status: Not on file  . Intimate partner violence:    Fear of current or ex partner: Not on file    Emotionally abused: Not on file    Physically abused: Not on file    Forced sexual activity: Not on file  Other Topics Concern  . Not on file  Social History Narrative  . Not on file    No Known Allergies  Prior to Admission medications   Medication Sig Start Date End Date Taking? Authorizing Provider  amphetamine-dextroamphetamine (ADDERALL XR) 5 MG 24 hr capsule Take 1 capsule (5 mg total) by mouth daily for 10 days. 01/24/18 02/03/18  Conard Novak, MD    Physical Exam BP 110/66   Wt 130 lb (59 kg)   LMP 12/10/2017 (Exact Date)   BMI 24.17 kg/m    General: NAD HEENT:  normocephalic, anicteric Pulmonary: No increased work of breathing Extremities: no edema, erythema, or tenderness Neurologic: Grossly intact, normal gait Psychiatric: mood appropriate, affect full  Imaging Results US Ob Transvaginal  Result Date: 02/19/2018 Patient Name: Sherry Reynolds DOB: December 18, 1990 MRN: [id number] ULTRASOUND REPORT Location: [facility] Date of Service: 02/19/2018 Indications: Follow up viabiltiy Findings: Intrauterine gestational sac measuring 16.10mm, [redacted]w[redacted]d with three echogenic circles (yolk sac vs other) and one large echogenic circle (questionable empty amnion). No fetal pole seen. Right Ovary is normal in appearance. Left Ovary is normal appearance. Survey of the adnexa demonstrates no adnexal masses. There is no free peritoneal fluid in the cul de sac. Impression: 1. Intrauterine gestational sac measuring 16.42mm, [redacted]w[redacted]d with three echogenic circles (yolk sac vs other) and  one large echogenic circle (questionable empty amnion). No fetal pole seen.  Darlina Guys, RDMS RVT The ultrasound images and findings were reviewed by me and I agree with the above report. Thomasene Mohair, MD, [facility], [facility] 02/19/2018 3:02 PM      Assessment: 28 y.o. [id number]  1. Supervision of other normal pregnancy, antepartum      Plan: Problem List Items Addressed This Visit      Other   Supervision of other normal pregnancy, antepartum - Primary   Relevant Orders   US OB Comp Less 14 Wks     Discussed that ultrasound and lab findings are highly concerning for anembryonic pregnancy.  She would like to be sure. So, will get another ultrasound in 2 weeks to evaluate. Discussed that she could start having symptoms of miscarriage and what the normal amount of bleeding might be. We did start discussing the treatment options for missed abortion.  Though not impossible, it seems highly improbable that this will result in a normal pregnancy.    20 minutes spent in face to face discussion with > 50% spent in counseling,management, and coordination of care of her supervision of pregnancy.   Thomasene Mohair, MD, [facility], [facility] 02/19/2018 4:59 PM

## 2018-03-04 ENCOUNTER — Telehealth: Payer: Self-pay

## 2018-03-04 ENCOUNTER — Encounter: Payer: Medicaid Other | Admitting: Obstetrics and Gynecology

## 2018-03-04 ENCOUNTER — Ambulatory Visit: Payer: Medicaid Other

## 2018-03-04 NOTE — Telephone Encounter (Signed)
Trying to call pt to check on her since she did not show for her u/s today. Please let me know when she calls back and get u/s rescheduled.

## 2018-06-23 ENCOUNTER — Ambulatory Visit (INDEPENDENT_AMBULATORY_CARE_PROVIDER_SITE_OTHER): Payer: Medicaid Other | Admitting: Obstetrics and Gynecology

## 2018-06-23 ENCOUNTER — Encounter: Payer: Self-pay | Admitting: Obstetrics and Gynecology

## 2018-06-23 ENCOUNTER — Other Ambulatory Visit: Payer: Self-pay

## 2018-06-23 ENCOUNTER — Other Ambulatory Visit (HOSPITAL_COMMUNITY)
Admission: RE | Admit: 2018-06-23 | Discharge: 2018-06-23 | Disposition: A | Discharge status: Home / Self Care | Payer: Medicaid Other | Source: Ambulatory Visit | Attending: Obstetrics and Gynecology | Admitting: Obstetrics and Gynecology

## 2018-06-23 VITALS — BP 122/78 | Wt 135.0 lb

## 2018-06-23 DIAGNOSIS — N96 Recurrent pregnancy loss: Secondary | ICD-10-CM

## 2018-06-23 DIAGNOSIS — O99331 Smoking (tobacco) complicating pregnancy, first trimester: Secondary | ICD-10-CM | POA: Diagnosis not present

## 2018-06-23 DIAGNOSIS — Z3480 Encounter for supervision of other normal pregnancy, unspecified trimester: Secondary | ICD-10-CM | POA: Insufficient documentation

## 2018-06-23 DIAGNOSIS — Z3A01 Less than 8 weeks gestation of pregnancy: Secondary | ICD-10-CM

## 2018-06-23 DIAGNOSIS — Z3493 Encounter for supervision of normal pregnancy, unspecified, third trimester: Secondary | ICD-10-CM | POA: Insufficient documentation

## 2018-06-23 DIAGNOSIS — F17210 Nicotine dependence, cigarettes, uncomplicated: Secondary | ICD-10-CM | POA: Diagnosis not present

## 2018-06-23 DIAGNOSIS — Z720 Tobacco use: Secondary | ICD-10-CM

## 2018-06-23 NOTE — Progress Notes (Signed)
New Obstetric Patient H&P   Chief Complaint: "Desires prenatal care"  History of Present Illness: Patient is a 28 y.o. 725P1031  female, LMP 05/05/2018 presents with amenorrhea and positive home pregnancy test. Based on her  LMP, her EDD is Estimated Date of Delivery: 02/09/19 and her EGA is 4872w0d. Cycles are 5. days, regular, and occur approximately every : 30 days. Her last pap smear was 1 years ago and was no abnormalities.    She had a urine pregnancy test which was positive 2 week(s)  ago. Her last menstrual period was normal and lasted for  5 or 6 day(s). Since her LMP she claims she has experienced one episode of mild spotting. Her past medical history is notable for ADHD. She is currently taking no medications for her ADHD . Her prior pregnancies are notable for SAB x 3.  Since her LMP, she admits to the use of tobacco products  Yes. She is trying to quite. She has vaped in the past. She claims she has gained zero pounds since the start of her pregnancy.  There are cats in the home in the home  no  She admits close contact with children on a regular basis  yes  She has had chicken pox in the past no She has had Tuberculosis exposures, symptoms, or previously tested positive for TB   no Current or past history of domestic violence. no  Genetic Screening/Teratology Counseling: (Includes patient, baby's father, or anyone in either family with:)   1. [facility]'s [facility]/= 2[facility]  no 2. Thalassemia ([place], [place], Mediterranean, or Asian background): MCV<80  no 3. Neural tube defect (meningomyelocele, spina bifida, anencephaly)  no 4. Congenital heart defect  no  5. Down syndrome  no 6. Tay-Sachs (Jewish, [place])  no 7. Canavan's Disease  no 8. Sickle cell disease or trait (African)  no  9. Hemophilia or other blood disorders  no  10. Muscular dystrophy  no  11. Cystic fibrosis  no  12. Huntington's Chorea  no  13. Mental retardation/autism  no 14. Other inherited genetic or  chromosomal disorder  no 15. Maternal metabolic disorder (DM, PKU, etc)  no 16. Patient or FOB with a child with a birth defect not listed above no  16a. Patient or FOB with a birth defect themselves no 17. Recurrent pregnancy loss, or stillbirth  no  18. Any medications since LMP other than prenatal vitamins (include vitamins, supplements, OTC meds, drugs, alcohol)  no 19. Any other genetic/environmental exposure to discuss  no  Infection History:   1. Lives with someone with TB or TB exposed  no  2. Patient or partner has history of genital herpes  no 3. Rash or viral illness since LMP  no 4. History of STI (GC, CT, HPV, syphilis, HIV)  no 5. History of recent travel :  no  Other pertinent information:  no    Review of Systems:10 point review of systems negative unless otherwise noted in HPI  Past Medical History:  Diagnosis Date  . ADD (attention deficit disorder)   . Miscarriage    Three times     Past Surgical History:  Procedure Laterality Date  . BREAST ENHANCEMENT SURGERY    . WISDOM TOOTH EXTRACTION      Gynecologic History: Patient's last menstrual period was 05/05/2018.  Obstetric History: [id number], s/p SVD x 1, no h/o of D&C  Family History  Problem Relation Age of Onset  . Endometriosis Mother  Social History   Socioeconomic History  . Marital status: Single    Spouse name: Not on file  . Number of children: Not on file  . Years of education: Not on file  . Highest education level: Not on file  Occupational History  . Not on file  Social Needs  . Financial resource strain: Not on file  . Food insecurity    Worry: Not on file    Inability: Not on file  . Transportation needs    Medical: Not on file    Non-medical: Not on file  Tobacco Use  . Smoking status: Current Some Day Smoker    Types: E-cigarettes  . Smokeless tobacco: Never Used  Substance and Sexual Activity  . Alcohol use: Yes    Alcohol/week: 2.0 - 3.0 standard drinks     Types: 2 - 3 Standard drinks or equivalent per week  . Drug use: No  . Sexual activity: Yes    Partners: Male    Birth control/protection: None  Lifestyle  . Physical activity    Days per week: Not on file    Minutes per session: Not on file  . Stress: Not on file  Relationships  . Social Herbalist on phone: Not on file    Gets together: Not on file    Attends religious service: Not on file    Active member of club or organization: Not on file    Attends meetings of clubs or organizations: Not on file    Relationship status: Not on file  . Intimate partner violence    Fear of current or ex partner: Not on file    Emotionally abused: Not on file    Physically abused: Not on file    Forced sexual activity: Not on file  Other Topics Concern  . Not on file  Social History Narrative  . Not on file    No Known Allergies  Prior to Admission medications: Denies    Physical Exam BP 122/78   Wt 135 lb (61.2 kg)   LMP 05/05/2018   Breastfeeding Unknown   BMI 25.09 kg/m   Physical Exam Exam conducted with a chaperone present.  Constitutional:      General: She is not in acute distress.    Appearance: Normal appearance.  HENT:     Head: Normocephalic and atraumatic.  Eyes:     General: No scleral icterus.    Conjunctiva/sclera: Conjunctivae normal.  Neck:     Musculoskeletal: Neck supple. No neck rigidity.  Cardiovascular:     Rate and Rhythm: Normal rate and regular rhythm.     Heart sounds: No murmur. No friction rub. No gallop.   Pulmonary:     Effort: Pulmonary effort is normal.     Breath sounds: Normal breath sounds. No wheezing, rhonchi or rales.  Abdominal:     General: There is no distension.     Palpations: Abdomen is soft. There is no mass.     Tenderness: There is no abdominal tenderness. There is no guarding.     Hernia: There is no hernia in the left inguinal area or right inguinal area.  Genitourinary:    General: Normal vulva.     Exam  position: Lithotomy position.     Tanner stage (genital): 5.     Labia:        Right: No rash, tenderness, lesion or injury.        Left: No rash, tenderness, lesion or  injury.      Urethra: No prolapse.     Vagina: Normal.     Cervix: Normal.     Uterus: Normal.      Adnexa: Right adnexa normal and left adnexa normal.  Musculoskeletal: Normal range of motion.        General: No swelling, tenderness or deformity.     Right lower leg: No edema.     Left lower leg: No edema.  Lymphadenopathy:     Lower Body: No right inguinal adenopathy. No left inguinal adenopathy.  Skin:    General: Skin is warm and dry.  Neurological:     General: No focal deficit present.     Mental Status: She is alert and oriented to person, place, and time.     Cranial Nerves: No cranial nerve deficit.  Psychiatric:        Mood and Affect: Mood normal.        Behavior: Behavior normal.        Judgment: Judgment normal.    Female Chaperone present during breast and/or pelvic exam.  Bedside pelvic ultrasound shows single, living IUP with cardiac rate of 100bpm.  CRL consistent with EGA of 3341w1d.  This would change her EDD.  Assessment: 28 y.o. [id number] at 5941w1d presenting to initiate prenatal care  Plan: 1) Avoid alcoholic beverages. 2) Patient encouraged not to smoke.  3) Discontinue the use of all non-medicinal drugs and chemicals.  4) Take prenatal vitamins daily.  5) Nutrition, food safety (fish, cheese advisories, and high nitrite foods) and exercise discussed. 6) Hospital and practice style discussed with cross coverage system.  7) Genetic Screening, such as with 1st Trimester Screening, cell free fetal DNA, AFP testing, and Ultrasound, as well as with amniocentesis and CVS as appropriate, is discussed with patient. At the conclusion of today's visit patient undecided genetic testing 8) Patient is asked about travel to areas at risk for the [place] virus, and counseled to avoid travel and exposure to  mosquitoes or sexual partners who may have themselves been exposed to the virus. Testing is discussed, and will be ordered as appropriate.  9) Formal ultrasound later this week to give exact dating and to check that slow heart rate has normalized.   Thomasene MohairStephen Jessica Seidman, MD 06/23/2018 3:00 PM

## 2018-06-24 NOTE — Addendum Note (Signed)
Addended by: Prentice Docker D on: 06/24/2018 06:08 PM   Modules accepted: Orders

## 2018-06-25 DIAGNOSIS — Z3A01 Less than 8 weeks gestation of pregnancy: Secondary | ICD-10-CM | POA: Diagnosis not present

## 2018-06-25 DIAGNOSIS — Z3480 Encounter for supervision of other normal pregnancy, unspecified trimester: Secondary | ICD-10-CM | POA: Diagnosis not present

## 2018-06-25 LAB — CERVICOVAGINAL ANCILLARY ONLY
Chlamydia: NEGATIVE
Neisseria Gonorrhea: NEGATIVE

## 2018-06-27 ENCOUNTER — Other Ambulatory Visit: Payer: Self-pay

## 2018-06-27 ENCOUNTER — Ambulatory Visit (INDEPENDENT_AMBULATORY_CARE_PROVIDER_SITE_OTHER): Payer: Medicaid Other

## 2018-06-27 DIAGNOSIS — O3481 Maternal care for other abnormalities of pelvic organs, first trimester: Secondary | ICD-10-CM | POA: Diagnosis not present

## 2018-06-27 DIAGNOSIS — Z3A01 Less than 8 weeks gestation of pregnancy: Secondary | ICD-10-CM

## 2018-06-27 DIAGNOSIS — N8312 Corpus luteum cyst of left ovary: Secondary | ICD-10-CM | POA: Diagnosis not present

## 2018-06-27 DIAGNOSIS — Z3480 Encounter for supervision of other normal pregnancy, unspecified trimester: Secondary | ICD-10-CM

## 2018-06-27 LAB — URINE CULTURE

## 2018-06-27 LAB — URINE DRUG PANEL 7
Amphetamines, Urine: NEGATIVE ng/mL
Barbiturate Quant, Ur: NEGATIVE ng/mL
Benzodiazepine Quant, Ur: NEGATIVE ng/mL
Cannabinoid Quant, Ur: NEGATIVE ng/mL
Cocaine (Metab.): NEGATIVE ng/mL
Opiate Quant, Ur: NEGATIVE ng/mL
PCP Quant, Ur: NEGATIVE ng/mL

## 2018-06-30 ENCOUNTER — Encounter: Payer: Medicaid Other | Admitting: Obstetrics and Gynecology

## 2018-07-03 ENCOUNTER — Ambulatory Visit (INDEPENDENT_AMBULATORY_CARE_PROVIDER_SITE_OTHER): Payer: Medicaid Other | Admitting: Obstetrics and Gynecology

## 2018-07-03 ENCOUNTER — Other Ambulatory Visit: Payer: Self-pay

## 2018-07-03 VITALS — BP 99/63 | Wt 135.0 lb

## 2018-07-03 DIAGNOSIS — O09291 Supervision of pregnancy with other poor reproductive or obstetric history, first trimester: Secondary | ICD-10-CM

## 2018-07-03 DIAGNOSIS — O99331 Smoking (tobacco) complicating pregnancy, first trimester: Secondary | ICD-10-CM

## 2018-07-03 DIAGNOSIS — Z3A01 Less than 8 weeks gestation of pregnancy: Secondary | ICD-10-CM

## 2018-07-03 DIAGNOSIS — F419 Anxiety disorder, unspecified: Secondary | ICD-10-CM

## 2018-07-03 DIAGNOSIS — Z720 Tobacco use: Secondary | ICD-10-CM

## 2018-07-03 DIAGNOSIS — Z3480 Encounter for supervision of other normal pregnancy, unspecified trimester: Secondary | ICD-10-CM

## 2018-07-03 DIAGNOSIS — N96 Recurrent pregnancy loss: Secondary | ICD-10-CM

## 2018-07-03 DIAGNOSIS — O99341 Other mental disorders complicating pregnancy, first trimester: Secondary | ICD-10-CM

## 2018-07-03 DIAGNOSIS — F329 Major depressive disorder, single episode, unspecified: Secondary | ICD-10-CM

## 2018-07-03 LAB — POCT URINALYSIS DIPSTICK OB
Glucose, UA: NEGATIVE
POC,PROTEIN,UA: NEGATIVE

## 2018-07-03 NOTE — Progress Notes (Signed)
  Routine Prenatal Care Visit  Subjective  Sherry Reynolds is a 28 y.o. G5P1031 at [redacted]w[redacted]d being seen today for ongoing prenatal care.  She is currently monitored for the following issues for this low-risk pregnancy and has ADD (attention deficit disorder); Anxiety and depression; Bilateral bunions; Family history of ovarian cancer; Tobacco use; Other specified behavioral and emotional disorders with onset usually occurring in childhood and adolescence; Supervision of other normal pregnancy, antepartum; and History of recurrent miscarriages on their problem list.  ----------------------------------------------------------------------------------- Patient reports no complaints.    . Vag. Bleeding: None.   . Denies leaking of fluid.  ----------------------------------------------------------------------------------- The following portions of the patient's history were reviewed and updated as appropriate: allergies, current medications, past family history, past medical history, past social history, past surgical history and problem list. Problem list updated.   Objective  Blood pressure 99/63, weight 135 lb (61.2 kg), last menstrual period 05/05/2018, unknown if currently breastfeeding. Pregravid weight 135 lb (61.2 kg) Total Weight Gain 0 lb (0 kg) Urinalysis: Urine Protein Negative  Urine Glucose Negative  Fetal Status: Fetal Heart Rate (bpm): 159         General:  Alert, oriented and cooperative. Patient is in no acute distress.  Skin: Skin is warm and dry. No rash noted.   Cardiovascular: Normal heart rate noted  Respiratory: Normal respiratory effort, no problems with respiration noted  Abdomen: Soft, gravid, appropriate for gestational age.       Pelvic:  Cervical exam deferred        Extremities: Normal range of motion.     Mental Status: Normal mood and affect. Normal behavior. Normal judgment and thought content.   BSUS for embryonic heart rate, interval growth  appropriate.  Assessment   28 y.o. [id number] at [redacted]w[redacted]d by  02/17/2019, by Ultrasound presenting for routine prenatal visit  Plan   Pregnancy 5 Problems (from 06/23/18 to present)    Problem Noted Resolved   Supervision of other normal pregnancy, antepartum 06/23/2018 by Will Bonnet, MD No   History of recurrent miscarriages 06/23/2018 by Will Bonnet, MD No   Anxiety and depression 03/09/2014 by Tomi Bamberger, CMA No     Preterm labor symptoms and general obstetric precautions including but not limited to vaginal bleeding, contractions, leaking of fluid and fetal movement were reviewed in detail with the patient. Please refer to After Visit Summary for other counseling recommendations.   Return in about 1 week (around 07/10/2018) for Routine Prenatal Appointment w Dr. Glennon Mac (may make weekly appointments for several weeks).  Prentice Docker, MD, [facility], [facility] 07/03/2018 5:32 PM

## 2018-07-04 ENCOUNTER — Encounter: Payer: Self-pay | Admitting: Obstetrics and Gynecology

## 2018-07-09 ENCOUNTER — Encounter: Payer: Self-pay | Admitting: Obstetrics and Gynecology

## 2018-07-09 ENCOUNTER — Other Ambulatory Visit: Payer: Self-pay | Admitting: Obstetrics and Gynecology

## 2018-07-09 ENCOUNTER — Other Ambulatory Visit: Payer: Self-pay

## 2018-07-09 ENCOUNTER — Ambulatory Visit (INDEPENDENT_AMBULATORY_CARE_PROVIDER_SITE_OTHER): Payer: Medicaid Other | Admitting: Obstetrics and Gynecology

## 2018-07-09 VITALS — BP 107/67 | Wt 135.0 lb

## 2018-07-09 DIAGNOSIS — F419 Anxiety disorder, unspecified: Secondary | ICD-10-CM

## 2018-07-09 DIAGNOSIS — O99341 Other mental disorders complicating pregnancy, first trimester: Secondary | ICD-10-CM

## 2018-07-09 DIAGNOSIS — Z3A08 8 weeks gestation of pregnancy: Secondary | ICD-10-CM

## 2018-07-09 DIAGNOSIS — Z3480 Encounter for supervision of other normal pregnancy, unspecified trimester: Secondary | ICD-10-CM

## 2018-07-09 DIAGNOSIS — N96 Recurrent pregnancy loss: Secondary | ICD-10-CM

## 2018-07-09 DIAGNOSIS — Z720 Tobacco use: Secondary | ICD-10-CM

## 2018-07-09 DIAGNOSIS — O200 Threatened abortion: Secondary | ICD-10-CM

## 2018-07-09 DIAGNOSIS — O09291 Supervision of pregnancy with other poor reproductive or obstetric history, first trimester: Secondary | ICD-10-CM

## 2018-07-09 DIAGNOSIS — F329 Major depressive disorder, single episode, unspecified: Secondary | ICD-10-CM

## 2018-07-09 LAB — POCT URINALYSIS DIPSTICK OB
Glucose, UA: NEGATIVE
POC,PROTEIN,UA: NEGATIVE

## 2018-07-09 NOTE — Progress Notes (Signed)
  Routine Prenatal Care Visit  Subjective  Sherry Reynolds is a 28 y.o. G5P1031 at [redacted]w[redacted]d being seen today for ongoing prenatal care.  She is currently monitored for the following issues for this low-risk pregnancy and has ADD (attention deficit disorder); Anxiety and depression; Bilateral bunions; Family history of ovarian cancer; Tobacco use; Other specified behavioral and emotional disorders with onset usually occurring in childhood and adolescence; Supervision of other normal pregnancy, antepartum; and History of recurrent miscarriages on their problem list.  ----------------------------------------------------------------------------------- Patient reports no complaints.    . Vag. Bleeding: None.   . Denies leaking of fluid.  ----------------------------------------------------------------------------------- The following portions of the patient's history were reviewed and updated as appropriate: allergies, current medications, past family history, past medical history, past social history, past surgical history and problem list. Problem list updated.   Objective  Blood pressure 107/67, weight 135 lb (61.2 kg), last menstrual period 05/05/2018, unknown if currently breastfeeding. Pregravid weight 135 lb (61.2 kg) Total Weight Gain 0 lb (0 kg) Urinalysis: Urine Protein Negative  Urine Glucose Negative  Fetal Status: Fetal Heart Rate (bpm): absent         General:  Alert, oriented and cooperative. Patient is in no acute distress.  Skin: Skin is warm and dry. No rash noted.   Cardiovascular: Normal heart rate noted  Respiratory: Normal respiratory effort, no problems with respiration noted  Abdomen: Soft, gravid, appropriate for gestational age.       Pelvic:  Cervical exam deferred        Extremities: Normal range of motion.     Mental Status: Normal mood and affect. Normal behavior. Normal judgment and thought content.   Bedside ultrasound (both transabdominally and transvaginally) shows  no embryonic cardiac activity.  CRL consistent with [redacted] week gestation.    Assessment   28 y.o. [id number] at [redacted]w[redacted]d by  02/17/2019, by Ultrasound presenting for routine prenatal visit  Plan   Pregnancy 5 Problems (from 06/23/18 to present)    Problem Noted Resolved   Supervision of other normal pregnancy, antepartum 06/23/2018 by Will Bonnet, MD No   History of recurrent miscarriages 06/23/2018 by Will Bonnet, MD No   Anxiety and depression 03/09/2014 by Tomi Bamberger, CMA No       Preterm labor symptoms and general obstetric precautions including but not limited to vaginal bleeding, contractions, leaking of fluid and fetal movement were reviewed in detail with the patient. Please refer to After Visit Summary for other counseling recommendations.   Discussed likely miscarriage. This has happened for the fourth time. We briefly discussed a workup for recurrent miscarriage.  She would like a formal ultrasound to verify.  One has been ordered given the importance of this diagnosis.  She will obtain an ultrasound as soon as I can get her one and I will call her to go over the results.   I discovered I couldn't find a blood type in the system. Will get tomorrow, as well.   Return in about 1 day (around 07/10/2018) for Schedule pelvic u/s and I'll call with results.  Prentice Docker, MD, [facility], [facility] 07/09/2018 2:41 PM

## 2018-07-10 ENCOUNTER — Other Ambulatory Visit (INDEPENDENT_AMBULATORY_CARE_PROVIDER_SITE_OTHER): Payer: Medicaid Other

## 2018-07-10 ENCOUNTER — Telehealth: Payer: Self-pay | Admitting: Obstetrics and Gynecology

## 2018-07-10 ENCOUNTER — Other Ambulatory Visit: Payer: Medicaid Other

## 2018-07-10 ENCOUNTER — Other Ambulatory Visit: Payer: Self-pay | Admitting: Obstetrics and Gynecology

## 2018-07-10 DIAGNOSIS — Z3A08 8 weeks gestation of pregnancy: Secondary | ICD-10-CM | POA: Diagnosis not present

## 2018-07-10 DIAGNOSIS — Z0189 Encounter for other specified special examinations: Secondary | ICD-10-CM

## 2018-07-10 DIAGNOSIS — O3481 Maternal care for other abnormalities of pelvic organs, first trimester: Secondary | ICD-10-CM

## 2018-07-10 DIAGNOSIS — Z3480 Encounter for supervision of other normal pregnancy, unspecified trimester: Secondary | ICD-10-CM

## 2018-07-10 DIAGNOSIS — N8312 Corpus luteum cyst of left ovary: Secondary | ICD-10-CM

## 2018-07-10 NOTE — Telephone Encounter (Signed)
Discussed u/s findings of no cardiac activity, consistent with missed AB.  Discussed RPL and potential workup. She will consider. Discussed management options for missed AB.  She elects to allow tissue to pass naturally.  She will call no later than 4 weeks, if she has not passed her POC.

## 2018-07-15 LAB — RPR+RH+ABO+RUB AB+AB SCR+CB...
Antibody Screen: NEGATIVE
HIV Screen 4th Generation wRfx: NONREACTIVE
Hematocrit: 36.3 % (ref 34.0–46.6)
Hemoglobin: 12.8 g/dL (ref 11.1–15.9)
Hepatitis B Surface Ag: NEGATIVE
MCH: 31.4 pg (ref 26.6–33.0)
MCHC: 35.3 g/dL (ref 31.5–35.7)
MCV: 89 fL (ref 79–97)
Platelets: 198 10*3/uL (ref 150–450)
RBC: 4.08 x10E6/uL (ref 3.77–5.28)
RDW: 11.6 % — ABNORMAL LOW (ref 11.7–15.4)
RPR Ser Ql: NONREACTIVE
Rh Factor: POSITIVE
Rubella Antibodies, IGG: 1.4 index (ref >0.99)
Varicella zoster IgG: 171 index (ref >165)
WBC: 4.1 10*3/uL (ref 3.4–10.8)

## 2018-07-16 ENCOUNTER — Encounter: Payer: Medicaid Other | Admitting: Obstetrics and Gynecology

## 2018-07-17 ENCOUNTER — Telehealth: Payer: Self-pay

## 2018-07-17 NOTE — Telephone Encounter (Signed)
Called pt to let her know CRS advised her to not come tomorrow and to give it a few weeks to see if it will pass on its own but to let us know if she starts having severe pain or a vaginal odor. Pt is having headaches everyday and extremely tired. Wanted to know if she could have medication to help it pass because she is feeling worse and worse everyday. Would like SDJ to call and speak with her about everything.

## 2018-07-17 NOTE — Telephone Encounter (Signed)
Pt calling; has questions.  [phone]  Left detailed msg SDJ not in office today.  Will be back tomorrow.  To call nurse line c what exactly her questions are.

## 2018-07-17 NOTE — Telephone Encounter (Signed)
Pt returning my call.  Pt was in last week; no FH activity on u/s; miscarried; usually 1-2 days after finding out she miscarried she starts bleeding.  She has not bled any; still has nausea and h/a; has no cramping; had a positive preg test.  Has appt tomorrow c u/s and SDJ.  Should she keep appt for u/s?  [phone]

## 2018-07-17 NOTE — Telephone Encounter (Signed)
See other msg from today.

## 2018-07-18 ENCOUNTER — Encounter: Payer: Medicaid Other | Admitting: Obstetrics and Gynecology

## 2018-07-18 NOTE — Telephone Encounter (Signed)
Tried to call pt to see if she was still coming to appt today. SDJ and I were off yesterday. Please let meknow when she calls back. If she does not come I iwll have SDJ call her back about everything

## 2018-07-18 NOTE — Telephone Encounter (Signed)
Pt coming in today to see SDJ

## 2018-07-25 NOTE — Telephone Encounter (Signed)
Pt calling; wants to talk to SDJ about sxs she's having.  [phone]

## 2018-07-26 NOTE — Telephone Encounter (Signed)
Spoke with patient. She states that she is having symptoms that to her are consistent with ongoing pregnancy. She has a friend who has a doppler and she thought she heard a faint heart tone. We discussed that she has a lot of blood vessels in this area and that the likelihood is that she found her own pulse with the doppler.  She is going to her friend's house today and I advised her to compare her own pulse rate to that of the one she hears. If there is a significant difference, then she might be right.  I did reiterate that I am highly confident in my diagnosis. But, would be glad to be wrong and that it is better to be sure. She can make an appointment for any day I am in clinic next week to have a quick bedside ultrasound to verify.

## 2018-07-30 ENCOUNTER — Encounter: Payer: Self-pay | Admitting: Obstetrics and Gynecology

## 2018-07-30 ENCOUNTER — Ambulatory Visit (INDEPENDENT_AMBULATORY_CARE_PROVIDER_SITE_OTHER): Payer: Medicaid Other | Admitting: Obstetrics and Gynecology

## 2018-07-30 ENCOUNTER — Other Ambulatory Visit: Payer: Self-pay

## 2018-07-30 VITALS — BP 124/78 | Ht 61.0 in | Wt 137.0 lb

## 2018-07-30 DIAGNOSIS — O021 Missed abortion: Secondary | ICD-10-CM

## 2018-07-30 MED ORDER — HYDROCODONE-ACETAMINOPHEN 5-325 MG PO TABS: 1.0000 | ORAL_TABLET | Freq: Four times a day (QID) | ORAL | 0 refills | Status: DC | PRN

## 2018-07-30 MED ORDER — ONDANSETRON 4 MG PO TBDP: 4.0000 mg | ORAL_TABLET | Freq: Four times a day (QID) | ORAL | 0 refills | Status: DC | PRN

## 2018-07-30 MED ORDER — MISOPROSTOL 200 MCG PO TABS: ORAL_TABLET | ORAL | 0 refills | Status: DC

## 2018-07-30 NOTE — Progress Notes (Signed)
Obstetrics & Gynecology Office Visit   Chief Complaint  Patient presents with  . Follow-up  missed abortion  History of Present Illness: 28 y.o. 405P1041 female who presents to verify she has had a missed abortion. She has had worsening symptoms of pregnancy and states that she and her friend were able to find heart tones using a doppler at home.  She has had occasional spotting over the past few days. None today.    Past Medical History:  Diagnosis Date  . ADD (attention deficit disorder)   . Miscarriage    twice    Past Surgical History:  Procedure Laterality Date  . BREAST ENHANCEMENT SURGERY    . WISDOM TOOTH EXTRACTION      Gynecologic History: Patient's last menstrual period was 05/05/2018.  Obstetric History: [id number]  Family History  Problem Relation Age of Onset  . Endometriosis Mother     Social History   Socioeconomic History  . Marital status: Single    Spouse name: Not on file  . Number of children: Not on file  . Years of education: Not on file  . Highest education level: Not on file  Occupational History  . Not on file  Social Needs  . Financial resource strain: Not on file  . Food insecurity    Worry: Not on file    Inability: Not on file  . Transportation needs    Medical: Not on file    Non-medical: Not on file  Tobacco Use  . Smoking status: Current Some Day Smoker    Types: E-cigarettes  . Smokeless tobacco: Never Used  Substance and Sexual Activity  . Alcohol use: Yes    Alcohol/week: 2.0 - 3.0 standard drinks    Types: 2 - 3 Standard drinks or equivalent per week  . Drug use: No  . Sexual activity: Yes    Partners: Male    Birth control/protection: None  Lifestyle  . Physical activity    Days per week: Not on file    Minutes per session: Not on file  . Stress: Not on file  Relationships  . Social Musicianconnections    Talks on phone: Not on file    Gets together: Not on file    Attends religious service: Not on file    Active  member of club or organization: Not on file    Attends meetings of clubs or organizations: Not on file    Relationship status: Not on file  . Intimate partner violence    Fear of current or ex partner: Not on file    Emotionally abused: Not on file    Physically abused: Not on file    Forced sexual activity: Not on file  Other Topics Concern  . Not on file  Social History Narrative  . Not on file    No Known Allergies  Prior to Admission medications   Not on File    Review of Systems  Constitutional: Negative.   HENT: Negative.   Eyes: Negative.   Respiratory: Negative.   Cardiovascular: Negative.   Gastrointestinal: Negative.   Genitourinary: Negative.        See HPI  Musculoskeletal: Negative.   Skin: Negative.   Neurological: Negative.   Psychiatric/Behavioral: Negative.      Physical Exam BP 124/78   Ht 5\' 1"  (1.549 m)   Wt 137 lb (62.1 kg)   LMP 05/05/2018   BMI 25.89 kg/m  Patient's last menstrual period was 05/05/2018. Physical Exam Constitutional:  General: She is not in acute distress.    Appearance: Normal appearance.  HENT:     Head: Normocephalic and atraumatic.  Eyes:     General: No scleral icterus.    Conjunctiva/sclera: Conjunctivae normal.  Neurological:     General: No focal deficit present.     Mental Status: She is alert and oriented to person, place, and time.     Cranial Nerves: No cranial nerve deficit.  Psychiatric:        Mood and Affect: Mood normal.        Behavior: Behavior normal.        Judgment: Judgment normal.   BSUS: CRL 1.98 cm (was 2.0 cm 3 weeks ago - so no change), no cardiac activity   Assessment: 28 y.o. [id number] female here for  1. Missed abortion      Plan: Problem List Items Addressed This Visit    None    Visit Diagnoses    Missed abortion    -  Primary   Relevant Medications   misoprostol (CYTOTEC) 200 MCG tablet   ondansetron (ZOFRAN ODT) 4 MG disintegrating tablet   HYDROcodone-acetaminophen  (NORCO) 5-325 MG tablet     Verified missed abortion. Discussed treatment options of ongoing monitoring for up to 6 weeks, medication treatment (which she elected), and surgical management.  Prescription medication sent for misoprostol per the ACOG-recommended protocol along with medication for nausea and and very small amount for pain.  Prentice Docker, MD 07/30/2018 5:09 PM

## 2018-08-20 DIAGNOSIS — F988 Other specified behavioral and emotional disorders with onset usually occurring in childhood and adolescence: Secondary | ICD-10-CM | POA: Diagnosis not present

## 2018-12-26 ENCOUNTER — Telehealth: Payer: Self-pay | Admitting: Obstetrics and Gynecology

## 2018-12-26 ENCOUNTER — Other Ambulatory Visit: Payer: Self-pay | Admitting: Obstetrics and Gynecology

## 2018-12-26 DIAGNOSIS — R002 Palpitations: Secondary | ICD-10-CM

## 2018-12-26 DIAGNOSIS — Z3480 Encounter for supervision of other normal pregnancy, unspecified trimester: Secondary | ICD-10-CM

## 2018-12-26 NOTE — Telephone Encounter (Signed)
Has had episodes of headache, feeling like her heartbeat was fast, and she had a blood pressure on a home cuff of 138/89.  Is wondering if this is normal.   Last night she had a bad headache and for a while she felt like her heart was beating out of her chest. She didn't have a machine to check her BP. She also felt dizzy. She placed an ice pack on her chest.  And after 10-15 minutes her heart beat slowed down.  She states her LMP was 10/26, so her GA is [redacted]w[redacted]d.   Will get hCG, CBC, CMP, TSH/FT4 to assess other causes (molar pregnancy, anemia, thyroid issues, etc).

## 2018-12-29 ENCOUNTER — Other Ambulatory Visit: Payer: Self-pay

## 2018-12-29 ENCOUNTER — Other Ambulatory Visit: Payer: Medicaid Other

## 2018-12-29 DIAGNOSIS — Z3480 Encounter for supervision of other normal pregnancy, unspecified trimester: Secondary | ICD-10-CM | POA: Diagnosis not present

## 2018-12-29 DIAGNOSIS — R002 Palpitations: Secondary | ICD-10-CM | POA: Diagnosis not present

## 2018-12-30 LAB — COMPREHENSIVE METABOLIC PANEL
ALT: 13 IU/L (ref 0–32)
AST: 17 IU/L (ref 0–40)
Albumin/Globulin Ratio: 2.2 (ref 1.2–2.2)
Albumin: 4.4 g/dL (ref 3.9–5.0)
Alkaline Phosphatase: 43 IU/L (ref 39–117)
BUN/Creatinine Ratio: 18 (ref 9–23)
BUN: 9 mg/dL (ref 6–20)
Bilirubin Total: 0.4 mg/dL (ref 0.0–1.2)
CO2: 20 mmol/L (ref 20–29)
Calcium: 9 mg/dL (ref 8.7–10.2)
Chloride: 100 mmol/L (ref 96–106)
Creatinine, Ser: 0.49 mg/dL — ABNORMAL LOW (ref 0.57–1.00)
GFR calc Af Amer: 153 mL/min/{1.73_m2} (ref >59)
GFR calc non Af Amer: 133 mL/min/{1.73_m2} (ref >59)
Globulin, Total: 2 g/dL (ref 1.5–4.5)
Glucose: 85 mg/dL (ref 65–99)
Potassium: 4.2 mmol/L (ref 3.5–5.2)
Sodium: 134 mmol/L (ref 134–144)
Total Protein: 6.4 g/dL (ref 6.0–8.5)

## 2018-12-30 LAB — CBC
Hematocrit: 33.6 % — ABNORMAL LOW (ref 34.0–46.6)
Hemoglobin: 11.5 g/dL (ref 11.1–15.9)
MCH: 31.6 pg (ref 26.6–33.0)
MCHC: 34.2 g/dL (ref 31.5–35.7)
MCV: 92 fL (ref 79–97)
Platelets: 223 10*3/uL (ref 150–450)
RBC: 3.64 x10E6/uL — ABNORMAL LOW (ref 3.77–5.28)
RDW: 12.1 % (ref 11.7–15.4)
WBC: 5 10*3/uL (ref 3.4–10.8)

## 2018-12-30 LAB — BETA HCG QUANT (REF LAB): hCG Quant: 65483 m[IU]/mL

## 2018-12-30 LAB — TSH+FREE T4
Free T4: 0.85 ng/dL (ref 0.82–1.77)
TSH: 2.12 u[IU]/mL (ref 0.450–4.500)

## 2018-12-31 ENCOUNTER — Ambulatory Visit (INDEPENDENT_AMBULATORY_CARE_PROVIDER_SITE_OTHER): Payer: Medicaid Other | Admitting: Obstetrics and Gynecology

## 2018-12-31 ENCOUNTER — Other Ambulatory Visit: Payer: Self-pay

## 2018-12-31 ENCOUNTER — Other Ambulatory Visit (HOSPITAL_COMMUNITY)
Admission: RE | Admit: 2018-12-31 | Discharge: 2018-12-31 | Disposition: A | Discharge status: Home / Self Care | Payer: Medicaid Other | Source: Ambulatory Visit | Attending: Obstetrics and Gynecology | Admitting: Obstetrics and Gynecology

## 2018-12-31 ENCOUNTER — Encounter: Payer: Self-pay | Admitting: Obstetrics and Gynecology

## 2018-12-31 VITALS — BP 118/78 | HR 98 | Wt 127.0 lb

## 2018-12-31 DIAGNOSIS — O09291 Supervision of pregnancy with other poor reproductive or obstetric history, first trimester: Secondary | ICD-10-CM | POA: Diagnosis not present

## 2018-12-31 DIAGNOSIS — Z3480 Encounter for supervision of other normal pregnancy, unspecified trimester: Secondary | ICD-10-CM | POA: Insufficient documentation

## 2018-12-31 DIAGNOSIS — N96 Recurrent pregnancy loss: Secondary | ICD-10-CM

## 2018-12-31 DIAGNOSIS — Z3A08 8 weeks gestation of pregnancy: Secondary | ICD-10-CM | POA: Insufficient documentation

## 2018-12-31 DIAGNOSIS — R002 Palpitations: Secondary | ICD-10-CM | POA: Diagnosis not present

## 2018-12-31 DIAGNOSIS — F988 Other specified behavioral and emotional disorders with onset usually occurring in childhood and adolescence: Secondary | ICD-10-CM

## 2018-12-31 DIAGNOSIS — O219 Vomiting of pregnancy, unspecified: Secondary | ICD-10-CM

## 2018-12-31 MED ORDER — DOXYLAMINE-PYRIDOXINE 10-10 MG PO TBEC: 2.0000 | DELAYED_RELEASE_TABLET | Freq: Every day | ORAL | 5 refills | Status: DC

## 2018-12-31 NOTE — Progress Notes (Signed)
New Obstetric Patient H&P   Chief Complaint: "Desires prenatal care"   History of Present Illness: Patient is a 28 y.o. [id number] Unavailable female, LMP 11/03/2018 presents with amenorrhea and positive home pregnancy test. Based on her  LMP, her EDD is Estimated Date of Delivery: 08/10/19 and her EGA is 8154w2d. Cycles are 5. days, regular, and occur approximately every : 28 days. Her last pap smear was march 2019 and was no abnormalities.    She had a urine pregnancy test which was positive 3 or 4 week(s)  ago. Her last menstrual period was normal and lasted for  5 or 6 day(s). Since her LMP she claims she has experienced racing heart, nausea. She denies vaginal bleeding. Her past medical history is notable for ADHD . Her prior pregnancies are notable for multiple miscarriages.    Since her LMP, she admits to the use of tobacco products  yes She claims she has gained zero pounds since the start of her pregnancy.  There are cats in the home in the home  no  She admits close contact with children on a regular basis  yes  She has had chicken pox in the past no She has had Tuberculosis exposures, symptoms, or previously tested positive for TB   no Current or past history of domestic violence. no  Genetic Screening/Teratology Counseling: (Includes patient, baby's father, or anyone in either family with:)   1. [facility]= 8435 [facility]  no 2. Thalassemia ([place], [place], Mediterranean, or Asian background): MCV<80  no 3. Neural tube defect (meningomyelocele, spina bifida, anencephaly)  no 4. Congenital heart defect  no  5. Down syndrome  no 6. Tay-Sachs (Jewish, [place])  no 7. Canavan's Disease  no 8. Sickle cell disease or trait (African)  no  9. Hemophilia or other blood disorders  no  10. Muscular dystrophy  no  11. Cystic fibrosis  no  12. Huntington's Chorea  no  13. Mental retardation/autism  no 14. Other inherited genetic or chromosomal disorder  no 15. Maternal metabolic  disorder (DM, PKU, etc)  no 16. Patient or FOB with a child with a birth defect not listed above no  16a. Patient or FOB with a birth defect themselves no 17. Recurrent pregnancy loss, or stillbirth  yes  18. Any medications since LMP other than prenatal vitamins (include vitamins, supplements, OTC meds, drugs, alcohol)  Yes, adderall 19. Any other genetic/environmental exposure to discuss  no  Infection History:   1. Lives with someone with TB or TB exposed  no  2. Patient or partner has history of genital herpes  no 3. Rash or viral illness since LMP  no 4. History of STI (GC, CT, HPV, syphilis, HIV)  no 5. History of recent travel :  no  Other pertinent information:  no     Review of Systems:10 point review of systems negative unless otherwise noted in HPI  Past Medical History:  Diagnosis Date  . ADD (attention deficit disorder)   . Miscarriage    twice    Past Surgical History:  Procedure Laterality Date  . BREAST ENHANCEMENT SURGERY    . WISDOM TOOTH EXTRACTION      Gynecologic History: Patient's last menstrual period was 11/03/2018.  Obstetric History: [id number]  Family History  Problem Relation Age of Onset  . Endometriosis Mother     Social History   Socioeconomic History  . Marital status: Single    Spouse name: Not on file  . Number of  children: Not on file  . Years of education: Not on file  . Highest education level: Not on file  Occupational History  . Not on file  Tobacco Use  . Smoking status: Current Some Day Smoker    Packs/day: 0.25    Types: Cigarettes  . Smokeless tobacco: Never Used  Substance and Sexual Activity  . Alcohol use: Not Currently    Alcohol/week: 2.0 - 3.0 standard drinks    Types: 2 - 3 Standard drinks or equivalent per week  . Drug use: No  . Sexual activity: Yes    Partners: Male    Birth control/protection: None  Other Topics Concern  . Not on file  Social History Narrative  . Not on file   Social Determinants  of Health   Financial Resource Strain:   . Difficulty of Paying Living Expenses: Not on file  Food Insecurity:   . Worried About Programme researcher, broadcasting/film/video in the Last Year: Not on file  . Ran Out of Food in the Last Year: Not on file  Transportation Needs:   . Lack of Transportation (Medical): Not on file  . Lack of Transportation (Non-Medical): Not on file  Physical Activity:   . Days of Exercise per Week: Not on file  . Minutes of Exercise per Session: Not on file  Stress:   . Feeling of Stress : Not on file  Social Connections:   . Frequency of Communication with Friends and Family: Not on file  . Frequency of Social Gatherings with Friends and Family: Not on file  . Attends Religious Services: Not on file  . Active Member of Clubs or Organizations: Not on file  . Attends Banker Meetings: Not on file  . Marital Status: Not on file  Intimate Partner Violence:   . Fear of Current or Ex-Partner: Not on file  . Emotionally Abused: Not on file  . Physically Abused: Not on file  . Sexually Abused: Not on file    No Known Allergies  Prior to Admission medications   Medication Sig Start Date End Date Taking? Authorizing Provider  HYDROcodone-acetaminophen (NORCO) 5-325 MG tablet Take 1 tablet by mouth every 6 (six) hours as needed for moderate pain. 07/30/18   Conard Novak, MD  misoprostol (CYTOTEC) 200 MCG tablet Place four tablets vaginally, repeat in 6-hrs if needed 07/30/18   Conard Novak, MD  ondansetron (ZOFRAN ODT) 4 MG disintegrating tablet Take 1 tablet (4 mg total) by mouth every 6 (six) hours as needed for nausea. 07/30/18   Conard Novak, MD    Physical Exam BP 118/78   Pulse 98   Wt 127 lb (57.6 kg)   LMP 11/03/2018   Breastfeeding Unknown   BMI 24.00 kg/m   Physical Exam Exam conducted with a chaperone present.  Constitutional:      General: She is not in acute distress.    Appearance: Normal appearance. She is well-developed.  HENT:      Head: Normocephalic and atraumatic.  Eyes:     General: No scleral icterus.    Conjunctiva/sclera: Conjunctivae normal.  Neck:     Thyroid: No thyromegaly.  Cardiovascular:     Rate and Rhythm: Normal rate and regular rhythm.     Heart sounds: Normal heart sounds. No murmur. No friction rub. No gallop.   Pulmonary:     Effort: Pulmonary effort is normal.     Breath sounds: Normal breath sounds. No wheezing.  Abdominal:  General: There is no distension.     Palpations: Abdomen is soft. There is no mass.     Tenderness: There is no abdominal tenderness. There is no guarding or rebound.     Hernia: No hernia is present. There is no hernia in the left inguinal area.  Genitourinary:    General: Normal vulva.     Exam position: Lithotomy position.     Tanner stage (genital): 5.     Labia:        Right: No rash, tenderness or lesion.        Left: No rash, tenderness or lesion.      Vagina: Normal.     Cervix: Normal.     Uterus: Normal. Enlarged.      Adnexa: Right adnexa normal and left adnexa normal.  Musculoskeletal:        General: Normal range of motion.     Cervical back: Normal range of motion and neck supple.  Skin:    General: Skin is warm and dry.     Findings: No rash.  Neurological:     General: No focal deficit present.     Mental Status: She is alert and oriented to person, place, and time.     Cranial Nerves: No cranial nerve deficit.  Psychiatric:        Mood and Affect: Mood normal.        Behavior: Behavior normal.        Judgment: Judgment normal.     Female Chaperone present during breast and/or pelvic exam.  Bedside ultrasound: confirms single, living intrauterine pregnancy with CRL consistent with gestational age of [redacted]w[redacted]d, which confirms the EDD.  No abnormalities noted. This was a limited ultrasound due to being transabdominal.  Assessment: 28 y.o. T0P5465 at [redacted]w[redacted]d presenting to initiate prenatal care  Plan: 1) Avoid alcoholic beverages. 2)  Patient encouraged not to smoke.  3) Discontinue the use of all non-medicinal drugs and chemicals.  4) Take prenatal vitamins daily.  5) Nutrition, food safety (fish, cheese advisories, and high nitrite foods) and exercise discussed. 6) Hospital and practice style discussed with cross coverage system.  7) Genetic Screening, such as with 1st Trimester Screening, cell free fetal DNA, AFP testing, and Ultrasound, as well as with amniocentesis and CVS as appropriate, is discussed with patient. At the conclusion of today's visit patient undecided genetic testing 8) Patient is asked about travel to areas at risk for the [place] virus, and counseled to avoid travel and exposure to mosquitoes or sexual partners who may have themselves been exposed to the virus. Testing is discussed, and will be ordered as appropriate.   Prentice Docker, MD 12/31/2018 11:17 AM

## 2018-12-31 NOTE — Patient Instructions (Signed)
For nausea (these may be purchased over-the-counter): -Vitamin B6 (pyridoxine):  25 mg three times each day (may buy 100 mg tablet and take twice per day or try to cut into 4 equal pieces and take 1 piece three times each day).  - doxylamine (found in [employer] and other sleep agents that can be bought in the store): take 25 - 50 mg at bedtime.  May take up to 25 mg three time each day.  However, keep in mind that this might make you sleepy.  

## 2019-01-02 LAB — RPR+RH+ABO+RUB AB+AB SCR+CB...
Antibody Screen: NEGATIVE
HIV Screen 4th Generation wRfx: NONREACTIVE
Hematocrit: 36.8 % (ref 34.0–46.6)
Hemoglobin: 12.5 g/dL (ref 11.1–15.9)
Hepatitis B Surface Ag: NEGATIVE
MCH: 31.6 pg (ref 26.6–33.0)
MCHC: 34 g/dL (ref 31.5–35.7)
MCV: 93 fL (ref 79–97)
Platelets: 207 10*3/uL (ref 150–450)
RBC: 3.95 x10E6/uL (ref 3.77–5.28)
RDW: 11.8 % (ref 11.7–15.4)
RPR Ser Ql: NONREACTIVE
Rh Factor: POSITIVE
Rubella Antibodies, IGG: 1.33 index (ref >0.99)
Varicella zoster IgG: 148 index — ABNORMAL LOW (ref >165)
WBC: 5.9 10*3/uL (ref 3.4–10.8)

## 2019-01-02 LAB — TSH+FREE T4
Free T4: 1.02 ng/dL (ref 0.82–1.77)
TSH: 1.49 u[IU]/mL (ref 0.450–4.500)

## 2019-01-05 ENCOUNTER — Other Ambulatory Visit: Payer: Self-pay

## 2019-01-05 ENCOUNTER — Ambulatory Visit (INDEPENDENT_AMBULATORY_CARE_PROVIDER_SITE_OTHER): Payer: Medicaid Other

## 2019-01-05 ENCOUNTER — Encounter: Payer: Self-pay | Admitting: Obstetrics and Gynecology

## 2019-01-05 ENCOUNTER — Ambulatory Visit (INDEPENDENT_AMBULATORY_CARE_PROVIDER_SITE_OTHER): Payer: Medicaid Other | Admitting: Obstetrics and Gynecology

## 2019-01-05 VITALS — BP 116/74 | Wt 129.0 lb

## 2019-01-05 DIAGNOSIS — O3481 Maternal care for other abnormalities of pelvic organs, first trimester: Secondary | ICD-10-CM | POA: Diagnosis not present

## 2019-01-05 DIAGNOSIS — Z3A08 8 weeks gestation of pregnancy: Secondary | ICD-10-CM | POA: Diagnosis not present

## 2019-01-05 DIAGNOSIS — Z3480 Encounter for supervision of other normal pregnancy, unspecified trimester: Secondary | ICD-10-CM

## 2019-01-05 DIAGNOSIS — O09291 Supervision of pregnancy with other poor reproductive or obstetric history, first trimester: Secondary | ICD-10-CM

## 2019-01-05 DIAGNOSIS — N96 Recurrent pregnancy loss: Secondary | ICD-10-CM

## 2019-01-05 DIAGNOSIS — O219 Vomiting of pregnancy, unspecified: Secondary | ICD-10-CM

## 2019-01-05 DIAGNOSIS — Z3A09 9 weeks gestation of pregnancy: Secondary | ICD-10-CM

## 2019-01-05 LAB — CERVICOVAGINAL ANCILLARY ONLY
Chlamydia: NEGATIVE
Comment: NEGATIVE
Comment: NORMAL
Neisseria Gonorrhea: NEGATIVE

## 2019-01-05 NOTE — Progress Notes (Signed)
Routine Prenatal Care Visit  Subjective  Sherry Reynolds is a 28 y.o. [phone] at [redacted]w[redacted]d being seen today for ongoing prenatal care.  She is currently monitored for the following issues for this low-risk pregnancy and has ADD (attention deficit disorder); Anxiety and depression; Bilateral bunions; Family history of ovarian cancer; Tobacco use; Other specified behavioral and emotional disorders with onset usually occurring in childhood and adolescence; Supervision of other normal pregnancy, antepartum; History of recurrent miscarriages; Nausea and vomiting during pregnancy; and Palpitations on their problem list.  ----------------------------------------------------------------------------------- Patient reports no complaints.    .  .   Pincus Large Fluid denies.  U/S confirms EDD ----------------------------------------------------------------------------------- The following portions of the patient's history were reviewed and updated as appropriate: allergies, current medications, past family history, past medical history, past social history, past surgical history and problem list. Problem list updated.  Objective  Blood pressure 116/74, weight 129 lb (58.5 kg), last menstrual period 11/03/2018, unknown if currently breastfeeding. Pregravid weight 127 lb (57.6 kg) Total Weight Gain 2 lb (0.907 kg) Urinalysis: Urine Protein    Urine Glucose    Fetal Status: Fetal Heart Rate (bpm): Present         General:  Alert, oriented and cooperative. Patient is in no acute distress.  Skin: Skin is warm and dry. No rash noted.   Cardiovascular: Normal heart rate noted  Respiratory: Normal respiratory effort, no problems with respiration noted  Abdomen: Soft, gravid, appropriate for gestational age.       Pelvic:  Cervical exam deferred        Extremities: Normal range of motion.     Mental Status: Normal mood and affect. Normal behavior. Normal judgment and thought content.   Imaging Results US OB Comp  Less 14 Wks  Result Date: 01/05/2019 Patient Name: Sherry Reynolds DOB: August 11, 1990 MRN: [id number] ULTRASOUND REPORT Location: [facility] Date of Service: 01/05/2019 Indications:dating Findings: Mason Jim intrauterine pregnancy is visualized with a CRL consistent with [redacted]w[redacted]d gestation, giving an (U/S) EDD of 08/14/2019. The (U/S) EDD is consistent with the clinically established EDD of 08/10/2019. FHR: 173 BPM CRL measurement: 19.1 mm Yolk sac is visualized and appears normal. Amnion: visualized and appears normal Right Ovary is normal in appearance. Left Ovary is normal appearance. Corpus luteal cyst:  Left ovary Survey of the adnexa demonstrates no adnexal masses. There is no free peritoneal fluid in the cul de sac. Impression: 1. [redacted]w[redacted]d Viable Singleton Intrauterine pregnancy by U/S. 2. (U/S) EDD is consistent with Clinically established EDD of 08/10/2019. Deanna Artis, RT There is a viable singleton gestation.  Detailed evaluation of the fetal anatomy is precluded by early gestational age.  It must be noted that a normal ultrasound particular at this early gestational age is unable to rule out fetal aneuploidy, risk of first trimester miscarriage, or anatomic birth defects. Thomasene Mohair, MD, [facility], [facility] 01/05/2019 1:21 PM      Assessment   28 y.o. [id number] at [redacted]w[redacted]d by  08/10/2019, by Last Menstrual Period presenting for routine prenatal visit  Plan   Pregnancy#6 Problems (from 11/03/18 to present)    Problem Noted Resolved   Nausea and vomiting during pregnancy 12/31/2018 by Conard Novak, MD No   Palpitations 12/31/2018 by Conard Novak, MD No   Supervision of other normal pregnancy, antepartum 06/23/2018 by Conard Novak, MD No   History of recurrent miscarriages 06/23/2018 by Conard Novak, MD No   ADD (attention deficit disorder) 02/29/2016 by Everlene Other  G, DO No       Preterm labor symptoms and general obstetric precautions  including but not limited to vaginal bleeding, contractions, leaking of fluid and fetal movement were reviewed in detail with the patient. Please refer to After Visit Summary for other counseling recommendations.   -NIPT next appt  Return in about 1 week (around 01/12/2019) for Routine Prenatal Appointment ([facility]).  Prentice Docker, MD, [facility], [facility] 01/05/2019 1:42 PM

## 2019-01-07 ENCOUNTER — Other Ambulatory Visit: Payer: Self-pay | Admitting: Obstetrics and Gynecology

## 2019-01-07 ENCOUNTER — Telehealth: Payer: Self-pay | Admitting: Obstetrics and Gynecology

## 2019-01-07 DIAGNOSIS — K5900 Constipation, unspecified: Secondary | ICD-10-CM

## 2019-01-07 DIAGNOSIS — Z3480 Encounter for supervision of other normal pregnancy, unspecified trimester: Secondary | ICD-10-CM

## 2019-01-07 LAB — URINE DRUG PANEL 7
Amphetamines, Urine: POSITIVE — AB
Barbiturate Quant, Ur: NEGATIVE ng/mL
Benzodiazepine Quant, Ur: NEGATIVE ng/mL
Cannabinoid Quant, Ur: NEGATIVE ng/mL
Cocaine (Metab.): NEGATIVE ng/mL
Opiate Quant, Ur: NEGATIVE ng/mL
PCP Quant, Ur: NEGATIVE ng/mL

## 2019-01-07 LAB — URINE CULTURE: Organism ID, Bacteria: NO GROWTH

## 2019-01-07 MED ORDER — LACTULOSE 10 G PO PACK: 10.0000 g | PACK | Freq: Three times a day (TID) | ORAL | 1 refills | Status: DC | PRN

## 2019-01-07 NOTE — Telephone Encounter (Signed)
Patient is calling with severe constipation. Patient reports last bowel movement a week ago. Patient says she has tried a enema with no relief. Patient is wanting to know the next step for treatment. Please advise

## 2019-01-07 NOTE — Telephone Encounter (Signed)
I sent in an Rx and sent her a message about it.

## 2019-01-07 NOTE — Telephone Encounter (Signed)
Do you want to send in stool softener?

## 2019-01-09 NOTE — L&D Delivery Note (Signed)
Delivery Note At 11:26 AM a viable female was delivered via Vaginal, Spontaneous (Presentation: Middle Occiput Anterior).  APGAR: 7, 9; weight 7 lb 12.2 oz (3,520 g).   Placenta status: Spontaneous, Intact.  Cord: 3 vessels with the following complications: None.  Cord pH: n/a  Anesthesia: Epidural Episiotomy: None Lacerations: 1st degree Suture Repair: 3.0 vicryl Est. Blood Loss (mL):    Mom to postpartum.  Baby to Couplet care / Skin to Skin.  Called to see patient.  Mom pushed to deliver a viable female infant.  The head followed by shoulders, which delivered without difficulty, and the rest of the body.  No nuchal cord noted.  Baby to mom's chest.  Cord clamped and cut after > 1 min delay.  No cord blood obtained.  Placenta delivered spontaneously, intact, with a 3-vessel cord.  First degree vaginal laceration repaired with 3-0 Vicryl in standard fashion.  All counts correct.  Hemostasis obtained with IV pitocin and fundal massage. EBL 300 mL.     Thomasene Mohair, MD 08/13/2019, 2:54 PM

## 2019-01-15 ENCOUNTER — Other Ambulatory Visit: Payer: Self-pay

## 2019-01-15 ENCOUNTER — Encounter: Payer: Self-pay | Admitting: Obstetrics and Gynecology

## 2019-01-15 ENCOUNTER — Ambulatory Visit (INDEPENDENT_AMBULATORY_CARE_PROVIDER_SITE_OTHER): Payer: Medicaid Other | Admitting: Obstetrics and Gynecology

## 2019-01-15 VITALS — BP 125/73 | Wt 129.0 lb

## 2019-01-15 DIAGNOSIS — Z1379 Encounter for other screening for genetic and chromosomal anomalies: Secondary | ICD-10-CM | POA: Diagnosis not present

## 2019-01-15 DIAGNOSIS — N96 Recurrent pregnancy loss: Secondary | ICD-10-CM

## 2019-01-15 DIAGNOSIS — Z3480 Encounter for supervision of other normal pregnancy, unspecified trimester: Secondary | ICD-10-CM

## 2019-01-15 DIAGNOSIS — F419 Anxiety disorder, unspecified: Secondary | ICD-10-CM

## 2019-01-15 DIAGNOSIS — O219 Vomiting of pregnancy, unspecified: Secondary | ICD-10-CM

## 2019-01-15 DIAGNOSIS — F329 Major depressive disorder, single episode, unspecified: Secondary | ICD-10-CM

## 2019-01-15 DIAGNOSIS — Z3A10 10 weeks gestation of pregnancy: Secondary | ICD-10-CM | POA: Diagnosis not present

## 2019-01-15 LAB — POCT URINALYSIS DIPSTICK OB
Glucose, UA: NEGATIVE
POC,PROTEIN,UA: NEGATIVE

## 2019-01-15 MED ORDER — CITRANATAL ASSURE 35-1 & 300 MG PO MISC: 2.0000 | Freq: Every day | ORAL | 8 refills | Status: DC

## 2019-01-15 NOTE — Progress Notes (Signed)
  Routine Prenatal Care Visit  Subjective  Sherry Reynolds is a 29 y.o. [phone] at [redacted]w[redacted]d being seen today for ongoing prenatal care.  She is currently monitored for the following issues for this low-risk pregnancy and has ADD (attention deficit disorder); Anxiety and depression; Bilateral bunions; Family history of ovarian cancer; Tobacco use; Other specified behavioral and emotional disorders with onset usually occurring in childhood and adolescence; Supervision of other normal pregnancy, antepartum; History of recurrent miscarriages; Nausea and vomiting during pregnancy; and Palpitations on their problem list.  ----------------------------------------------------------------------------------- Patient reports no complaints.    . Vag. Bleeding: None.   . Leaking Fluid denies.  ----------------------------------------------------------------------------------- The following portions of the patient's history were reviewed and updated as appropriate: allergies, current medications, past family history, past medical history, past social history, past surgical history and problem list. Problem list updated.  Objective  Blood pressure 125/73, weight 129 lb (58.5 kg), last menstrual period 11/03/2018, unknown if currently breastfeeding. Pregravid weight 127 lb (57.6 kg) Total Weight Gain 2 lb (0.907 kg) Urinalysis: Urine Protein Negative  Urine Glucose Negative  Fetal Status: Fetal Heart Rate (bpm): 170         General:  Alert, oriented and cooperative. Patient is in no acute distress.  Skin: Skin is warm and dry. No rash noted.   Cardiovascular: Normal heart rate noted  Respiratory: Normal respiratory effort, no problems with respiration noted  Abdomen: Soft, gravid, appropriate for gestational age.       Pelvic:  Cervical exam deferred        Extremities: Normal range of motion.     Mental Status: Normal mood and affect. Normal behavior. Normal judgment and thought content.   Assessment   29  y.o. [id number] at [redacted]w[redacted]d by  08/10/2019, by Last Menstrual Period presenting for routine prenatal visit  Plan   Pregnancy#6 Problems (from 11/03/18 to present)    Problem Noted Resolved   Nausea and vomiting during pregnancy 12/31/2018 by Conard Novak, MD No   Palpitations 12/31/2018 by Conard Novak, MD No   Supervision of other normal pregnancy, antepartum 06/23/2018 by Conard Novak, MD No   History of recurrent miscarriages 06/23/2018 by Conard Novak, MD No   ADD (attention deficit disorder) 02/29/2016 by Tommie Sams, DO No       Preterm labor symptoms and general obstetric precautions including but not limited to vaginal bleeding, contractions, leaking of fluid and fetal movement were reviewed in detail with the patient. Please refer to After Visit Summary for other counseling recommendations.   - NIPT today  Return in about 1 week (around 01/22/2019) for weekly prenatal appointments with Dr. Jean Rosenthal.  Thomasene Mohair, MD, [facility], [facility] 01/15/2019 11:04 AM

## 2019-01-16 ENCOUNTER — Other Ambulatory Visit: Payer: Self-pay | Admitting: Obstetrics and Gynecology

## 2019-01-16 DIAGNOSIS — Z3480 Encounter for supervision of other normal pregnancy, unspecified trimester: Secondary | ICD-10-CM

## 2019-01-16 DIAGNOSIS — Z3A10 10 weeks gestation of pregnancy: Secondary | ICD-10-CM

## 2019-01-21 LAB — MATERNIT21 PLUS CORE+SCA
Fetal Fraction: 8
Monosomy X (Turner Syndrome): NOT DETECTED
Result (T21): NEGATIVE
Trisomy 13 (Patau syndrome): NEGATIVE
Trisomy 18 (Edwards syndrome): NEGATIVE
Trisomy 21 (Down syndrome): NEGATIVE
XXX (Triple X Syndrome): NOT DETECTED
XXY (Klinefelter Syndrome): NOT DETECTED
XYY (Jacobs Syndrome): NOT DETECTED

## 2019-01-23 ENCOUNTER — Ambulatory Visit (INDEPENDENT_AMBULATORY_CARE_PROVIDER_SITE_OTHER): Payer: Medicaid Other | Admitting: Obstetrics and Gynecology

## 2019-01-23 ENCOUNTER — Encounter: Payer: Self-pay | Admitting: Obstetrics and Gynecology

## 2019-01-23 ENCOUNTER — Other Ambulatory Visit: Payer: Self-pay

## 2019-01-23 VITALS — BP 124/70 | Wt 132.0 lb

## 2019-01-23 DIAGNOSIS — F419 Anxiety disorder, unspecified: Secondary | ICD-10-CM

## 2019-01-23 DIAGNOSIS — O219 Vomiting of pregnancy, unspecified: Secondary | ICD-10-CM

## 2019-01-23 DIAGNOSIS — F32A Depression, unspecified: Secondary | ICD-10-CM

## 2019-01-23 DIAGNOSIS — Z3480 Encounter for supervision of other normal pregnancy, unspecified trimester: Secondary | ICD-10-CM

## 2019-01-23 DIAGNOSIS — O99341 Other mental disorders complicating pregnancy, first trimester: Secondary | ICD-10-CM

## 2019-01-23 DIAGNOSIS — F329 Major depressive disorder, single episode, unspecified: Secondary | ICD-10-CM

## 2019-01-23 DIAGNOSIS — Z3A11 11 weeks gestation of pregnancy: Secondary | ICD-10-CM

## 2019-01-23 NOTE — Progress Notes (Signed)
  Routine Prenatal Care Visit  Subjective  Sherry Reynolds is a 29 y.o. [phone] at [redacted]w[redacted]d being seen today for ongoing prenatal care.  She is currently monitored for the following issues for this low-risk pregnancy and has ADD (attention deficit disorder); Anxiety and depression; Bilateral bunions; Family history of ovarian cancer; Tobacco use; Other specified behavioral and emotional disorders with onset usually occurring in childhood and adolescence; Supervision of other normal pregnancy, antepartum; History of recurrent miscarriages; Nausea and vomiting during pregnancy; and Palpitations on their problem list.  ----------------------------------------------------------------------------------- Patient reports no complaints.    . Vag. Bleeding: None.   . Leaking Fluid denies.  ----------------------------------------------------------------------------------- The following portions of the patient's history were reviewed and updated as appropriate: allergies, current medications, past family history, past medical history, past social history, past surgical history and problem list. Problem list updated.  Objective  Blood pressure 124/70, weight 132 lb (59.9 kg), last menstrual period 11/03/2018, unknown if currently breastfeeding. Pregravid weight 127 lb (57.6 kg) Total Weight Gain 5 lb (2.268 kg) Urinalysis: Urine Protein    Urine Glucose    Fetal Status: Fetal Heart Rate (bpm): 170         General:  Alert, oriented and cooperative. Patient is in no acute distress.  Skin: Skin is warm and dry. No rash noted.   Cardiovascular: Normal heart rate noted  Respiratory: Normal respiratory effort, no problems with respiration noted  Abdomen: Soft, gravid, appropriate for gestational age.       Pelvic:  Cervical exam deferred        Extremities: Normal range of motion.     Mental Status: Normal mood and affect. Normal behavior. Normal judgment and thought content.   Assessment   29 y.o. [id number] at  [redacted]w[redacted]d by  08/10/2019, by Last Menstrual Period presenting for routine prenatal visit  Plan   Pregnancy#6 Problems (from 11/03/18 to present)    Problem Noted Resolved   Nausea and vomiting during pregnancy 12/31/2018 by Conard Novak, MD No   Palpitations 12/31/2018 by Conard Novak, MD No   Supervision of other normal pregnancy, antepartum 06/23/2018 by Conard Novak, MD No   History of recurrent miscarriages 06/23/2018 by Conard Novak, MD No   ADD (attention deficit disorder) 02/29/2016 by Tommie Sams, DO No       Preterm labor symptoms and general obstetric precautions including but not limited to vaginal bleeding, contractions, leaking of fluid and fetal movement were reviewed in detail with the patient. Please refer to After Visit Summary for other counseling recommendations.   Return for Continue weekly visits.  Thomasene Mohair, MD, [facility], [facility] 01/23/2019 9:26 AM

## 2019-01-30 ENCOUNTER — Ambulatory Visit (INDEPENDENT_AMBULATORY_CARE_PROVIDER_SITE_OTHER): Payer: Medicaid Other | Admitting: Obstetrics and Gynecology

## 2019-01-30 ENCOUNTER — Other Ambulatory Visit: Payer: Self-pay

## 2019-01-30 ENCOUNTER — Encounter: Payer: Self-pay | Admitting: Obstetrics and Gynecology

## 2019-01-30 VITALS — BP 122/74 | Wt 133.0 lb

## 2019-01-30 DIAGNOSIS — O99331 Smoking (tobacco) complicating pregnancy, first trimester: Secondary | ICD-10-CM

## 2019-01-30 DIAGNOSIS — Z3A12 12 weeks gestation of pregnancy: Secondary | ICD-10-CM

## 2019-01-30 DIAGNOSIS — Z3480 Encounter for supervision of other normal pregnancy, unspecified trimester: Secondary | ICD-10-CM

## 2019-01-30 DIAGNOSIS — O219 Vomiting of pregnancy, unspecified: Secondary | ICD-10-CM

## 2019-01-30 DIAGNOSIS — Z720 Tobacco use: Secondary | ICD-10-CM

## 2019-01-30 NOTE — Progress Notes (Signed)
  Routine Prenatal Care Visit  Subjective  Sherry Reynolds is a 29 y.o. [phone] at [redacted]w[redacted]d being seen today for ongoing prenatal care.  She is currently monitored for the following issues for this low-risk pregnancy and has ADD (attention deficit disorder); Anxiety and depression; Bilateral bunions; Family history of ovarian cancer; Tobacco use; Other specified behavioral and emotional disorders with onset usually occurring in childhood and adolescence; Supervision of other normal pregnancy, antepartum; History of recurrent miscarriages; Nausea and vomiting during pregnancy; and Palpitations on their problem list.  ----------------------------------------------------------------------------------- Patient reports no complaints.    . Vag. Bleeding: None.   . Leaking Fluid denies.  ----------------------------------------------------------------------------------- The following portions of the patient's history were reviewed and updated as appropriate: allergies, current medications, past family history, past medical history, past social history, past surgical history and problem list. Problem list updated.  Objective  Blood pressure 122/74, weight 133 lb (60.3 kg), last menstrual period 11/03/2018, unknown if currently breastfeeding. Pregravid weight 127 lb (57.6 kg) Total Weight Gain 6 lb (2.722 kg) Urinalysis: Urine Protein    Urine Glucose    Fetal Status: Fetal Heart Rate (bpm): 170         General:  Alert, oriented and cooperative. Patient is in no acute distress.  Skin: Skin is warm and dry. No rash noted.   Cardiovascular: Normal heart rate noted  Respiratory: Normal respiratory effort, no problems with respiration noted  Abdomen: Soft, gravid, appropriate for gestational age.       Pelvic:  Cervical exam deferred        Extremities: Normal range of motion.     Mental Status: Normal mood and affect. Normal behavior. Normal judgment and thought content.   Assessment   29 y.o. [id number] at  [redacted]w[redacted]d by  08/10/2019, by Last Menstrual Period presenting for routine prenatal visit  Plan   Pregnancy#6 Problems (from 11/03/18 to present)    Problem Noted Resolved   Nausea and vomiting during pregnancy 12/31/2018 by Conard Novak, MD No   Palpitations 12/31/2018 by Conard Novak, MD No   Supervision of other normal pregnancy, antepartum 06/23/2018 by Conard Novak, MD No   History of recurrent miscarriages 06/23/2018 by Conard Novak, MD No   ADD (attention deficit disorder) 02/29/2016 by Tommie Sams, DO No       Preterm labor symptoms and general obstetric precautions including but not limited to vaginal bleeding, contractions, leaking of fluid and fetal movement were reviewed in detail with the patient. Please refer to After Visit Summary for other counseling recommendations.   Return in about 1 week (around 02/06/2019) for Routine Prenatal Appointment.  Thomasene Mohair, MD, [facility], [facility] 01/30/2019 10:56 AM

## 2019-02-03 ENCOUNTER — Other Ambulatory Visit: Payer: Self-pay

## 2019-02-04 ENCOUNTER — Other Ambulatory Visit: Payer: Self-pay

## 2019-02-04 DIAGNOSIS — O219 Vomiting of pregnancy, unspecified: Secondary | ICD-10-CM

## 2019-02-04 DIAGNOSIS — Z3480 Encounter for supervision of other normal pregnancy, unspecified trimester: Secondary | ICD-10-CM

## 2019-02-04 MED ORDER — DOXYLAMINE-PYRIDOXINE 10-10 MG PO TBEC: 2.0000 | DELAYED_RELEASE_TABLET | Freq: Every day | ORAL | 5 refills | Status: DC

## 2019-02-05 ENCOUNTER — Encounter: Payer: Self-pay | Admitting: Obstetrics and Gynecology

## 2019-02-05 ENCOUNTER — Ambulatory Visit (INDEPENDENT_AMBULATORY_CARE_PROVIDER_SITE_OTHER): Payer: Medicaid Other | Admitting: Obstetrics and Gynecology

## 2019-02-05 ENCOUNTER — Other Ambulatory Visit: Payer: Self-pay

## 2019-02-05 VITALS — BP 126/67 | Wt 134.0 lb

## 2019-02-05 DIAGNOSIS — N96 Recurrent pregnancy loss: Secondary | ICD-10-CM

## 2019-02-05 DIAGNOSIS — O99332 Smoking (tobacco) complicating pregnancy, second trimester: Secondary | ICD-10-CM

## 2019-02-05 DIAGNOSIS — Z3A13 13 weeks gestation of pregnancy: Secondary | ICD-10-CM

## 2019-02-05 DIAGNOSIS — O99342 Other mental disorders complicating pregnancy, second trimester: Secondary | ICD-10-CM

## 2019-02-05 DIAGNOSIS — O2622 Pregnancy care for patient with recurrent pregnancy loss, second trimester: Secondary | ICD-10-CM

## 2019-02-05 DIAGNOSIS — Z3480 Encounter for supervision of other normal pregnancy, unspecified trimester: Secondary | ICD-10-CM

## 2019-02-05 LAB — POCT URINALYSIS DIPSTICK OB
Glucose, UA: NEGATIVE
POC,PROTEIN,UA: NEGATIVE

## 2019-02-05 NOTE — Progress Notes (Signed)
  Routine Prenatal Care Visit  Subjective  Sherry Reynolds is a 29 y.o. [phone] at [redacted]w[redacted]d being seen today for ongoing prenatal care.  She is currently monitored for the following issues for this low-risk pregnancy and has ADD (attention deficit disorder); Anxiety and depression; Bilateral bunions; Family history of ovarian cancer; Tobacco use; Other specified behavioral and emotional disorders with onset usually occurring in childhood and adolescence; Supervision of other normal pregnancy, antepartum; History of recurrent miscarriages; Nausea and vomiting during pregnancy; and Palpitations on their problem list.  ----------------------------------------------------------------------------------- Patient reports no complaints.    . Vag. Bleeding: None.   . Leaking Fluid denies.  ----------------------------------------------------------------------------------- The following portions of the patient's history were reviewed and updated as appropriate: allergies, current medications, past family history, past medical history, past social history, past surgical history and problem list. Problem list updated.  Objective  Blood pressure 126/67, weight 134 lb (60.8 kg), last menstrual period 11/03/2018, unknown if currently breastfeeding. Pregravid weight 127 lb (57.6 kg) Total Weight Gain 7 lb (3.175 kg) Urinalysis: Urine Protein Negative  Urine Glucose Negative  Fetal Status: Fetal Heart Rate (bpm): 152         General:  Alert, oriented and cooperative. Patient is in no acute distress.  Skin: Skin is warm and dry. No rash noted.   Cardiovascular: Normal heart rate noted  Respiratory: Normal respiratory effort, no problems with respiration noted  Abdomen: Soft, gravid, appropriate for gestational age.       Pelvic:  Cervical exam deferred        Extremities: Normal range of motion.     Mental Status: Normal mood and affect. Normal behavior. Normal judgment and thought content.   Assessment   29  y.o. [id number] at [redacted]w[redacted]d by  08/10/2019, by Last Menstrual Period presenting for routine prenatal visit  Plan   Pregnancy#6 Problems (from 11/03/18 to present)    Problem Noted Resolved   Nausea and vomiting during pregnancy 12/31/2018 by Conard Novak, MD No   Palpitations 12/31/2018 by Conard Novak, MD No   Supervision of other normal pregnancy, antepartum 06/23/2018 by Conard Novak, MD No   History of recurrent miscarriages 06/23/2018 by Conard Novak, MD No   ADD (attention deficit disorder) 02/29/2016 by Tommie Sams, DO No       Preterm labor symptoms and general obstetric precautions including but not limited to vaginal bleeding, contractions, leaking of fluid and fetal movement were reviewed in detail with the patient. Please refer to After Visit Summary for other counseling recommendations.   Return in about 1 week (around 02/12/2019) for Routine Prenatal Appointment.  Thomasene Mohair, MD, [facility], [facility] 02/05/2019 10:52 AM

## 2019-02-12 ENCOUNTER — Encounter: Payer: Self-pay | Admitting: Obstetrics and Gynecology

## 2019-02-12 ENCOUNTER — Other Ambulatory Visit: Payer: Self-pay

## 2019-02-12 ENCOUNTER — Ambulatory Visit (INDEPENDENT_AMBULATORY_CARE_PROVIDER_SITE_OTHER): Payer: Medicaid Other | Admitting: Obstetrics and Gynecology

## 2019-02-12 VITALS — BP 122/74 | Wt 134.0 lb

## 2019-02-12 DIAGNOSIS — F988 Other specified behavioral and emotional disorders with onset usually occurring in childhood and adolescence: Secondary | ICD-10-CM

## 2019-02-12 DIAGNOSIS — O219 Vomiting of pregnancy, unspecified: Secondary | ICD-10-CM

## 2019-02-12 DIAGNOSIS — O2622 Pregnancy care for patient with recurrent pregnancy loss, second trimester: Secondary | ICD-10-CM

## 2019-02-12 DIAGNOSIS — Z3A14 14 weeks gestation of pregnancy: Secondary | ICD-10-CM

## 2019-02-12 DIAGNOSIS — Z3480 Encounter for supervision of other normal pregnancy, unspecified trimester: Secondary | ICD-10-CM

## 2019-02-12 DIAGNOSIS — N96 Recurrent pregnancy loss: Secondary | ICD-10-CM

## 2019-02-12 MED ORDER — AMPHETAMINE-DEXTROAMPHET ER 5 MG PO CP24: 5.0000 mg | ORAL_CAPSULE | Freq: Every day | ORAL | 0 refills | Status: DC

## 2019-02-12 MED ORDER — CITRANATAL ASSURE 35-1 & 300 MG PO MISC: 2.0000 | Freq: Every day | ORAL | 8 refills | Status: DC

## 2019-02-12 NOTE — Progress Notes (Signed)
  Routine Prenatal Care Visit  Subjective  Sherry Reynolds is a 29 y.o. [phone] at [redacted]w[redacted]d being seen today for ongoing prenatal care.  She is currently monitored for the following issues for this low-risk pregnancy and has ADD (attention deficit disorder); Anxiety and depression; Bilateral bunions; Family history of ovarian cancer; Tobacco use; Other specified behavioral and emotional disorders with onset usually occurring in childhood and adolescence; Supervision of other normal pregnancy, antepartum; History of recurrent miscarriages; Nausea and vomiting during pregnancy; and Palpitations on their problem list.  ----------------------------------------------------------------------------------- Patient reports no complaints.    . Vag. Bleeding: None.   . Leaking Fluid denies.  ----------------------------------------------------------------------------------- The following portions of the patient's history were reviewed and updated as appropriate: allergies, current medications, past family history, past medical history, past social history, past surgical history and problem list. Problem list updated.  Objective  Blood pressure 122/74, weight 134 lb (60.8 kg), last menstrual period 11/03/2018, unknown if currently breastfeeding. Pregravid weight 127 lb (57.6 kg) Total Weight Gain 7 lb (3.175 kg) Urinalysis: Urine Protein    Urine Glucose    Fetal Status: Fetal Heart Rate (bpm): 160         General:  Alert, oriented and cooperative. Patient is in no acute distress.  Skin: Skin is warm and dry. No rash noted.   Cardiovascular: Normal heart rate noted  Respiratory: Normal respiratory effort, no problems with respiration noted  Abdomen: Soft, gravid, appropriate for gestational age.       Pelvic:  Cervical exam deferred        Extremities: Normal range of motion.     Mental Status: Normal mood and affect. Normal behavior. Normal judgment and thought content.   Assessment   29 y.o. [id number] at  [redacted]w[redacted]d by  08/10/2019, by Last Menstrual Period presenting for routine prenatal visit  Plan   Pregnancy#6 Problems (from 11/03/18 to present)    Problem Noted Resolved   Nausea and vomiting during pregnancy 12/31/2018 by Conard Novak, MD No   Palpitations 12/31/2018 by Conard Novak, MD No   Supervision of other normal pregnancy, antepartum 06/23/2018 by Conard Novak, MD No   History of recurrent miscarriages 06/23/2018 by Conard Novak, MD No   ADD (attention deficit disorder) 02/29/2016 by Tommie Sams, DO No       Preterm labor symptoms and general obstetric precautions including but not limited to vaginal bleeding, contractions, leaking of fluid and fetal movement were reviewed in detail with the patient. Please refer to After Visit Summary for other counseling recommendations.   - weaning adderall. Lower dose rx given, plan to stop. Start Wellbutrin, if needed. -Rx for Citranatal assure today  Return in about 1 week (around 02/19/2019) for Routine Prenatal Appointment ([facility]).  Thomasene Mohair, MD, [facility], [facility] 02/12/2019 2:51 PM

## 2019-02-19 ENCOUNTER — Other Ambulatory Visit: Payer: Self-pay

## 2019-02-19 ENCOUNTER — Ambulatory Visit (INDEPENDENT_AMBULATORY_CARE_PROVIDER_SITE_OTHER): Payer: Medicaid Other | Admitting: Obstetrics and Gynecology

## 2019-02-19 ENCOUNTER — Other Ambulatory Visit: Payer: Self-pay | Admitting: Obstetrics and Gynecology

## 2019-02-19 ENCOUNTER — Encounter: Payer: Self-pay | Admitting: Obstetrics and Gynecology

## 2019-02-19 VITALS — BP 125/68 | Wt 135.0 lb

## 2019-02-19 DIAGNOSIS — O210 Mild hyperemesis gravidarum: Secondary | ICD-10-CM

## 2019-02-19 DIAGNOSIS — F419 Anxiety disorder, unspecified: Secondary | ICD-10-CM

## 2019-02-19 DIAGNOSIS — Z3480 Encounter for supervision of other normal pregnancy, unspecified trimester: Secondary | ICD-10-CM

## 2019-02-19 DIAGNOSIS — O219 Vomiting of pregnancy, unspecified: Secondary | ICD-10-CM

## 2019-02-19 DIAGNOSIS — F988 Other specified behavioral and emotional disorders with onset usually occurring in childhood and adolescence: Secondary | ICD-10-CM

## 2019-02-19 DIAGNOSIS — O99342 Other mental disorders complicating pregnancy, second trimester: Secondary | ICD-10-CM

## 2019-02-19 DIAGNOSIS — F329 Major depressive disorder, single episode, unspecified: Secondary | ICD-10-CM

## 2019-02-19 DIAGNOSIS — Z3A15 15 weeks gestation of pregnancy: Secondary | ICD-10-CM

## 2019-02-19 LAB — POCT URINALYSIS DIPSTICK OB
Glucose, UA: NEGATIVE
POC,PROTEIN,UA: NEGATIVE

## 2019-02-19 MED ORDER — BUPROPION HCL ER (XL) 150 MG PO TB24: 150.0000 mg | ORAL_TABLET | Freq: Every day | ORAL | 2 refills | Status: DC

## 2019-02-19 MED ORDER — CITRANATAL ASSURE 35-1 & 300 MG PO MISC: 2.0000 | Freq: Every day | ORAL | 8 refills | Status: DC

## 2019-02-19 NOTE — Telephone Encounter (Signed)
Can you call them back and say that either Citranatal [place] or Citranatal [facility] would be OK?

## 2019-02-19 NOTE — Telephone Encounter (Signed)
Advise

## 2019-02-19 NOTE — Progress Notes (Signed)
  Routine Prenatal Care Visit  Subjective  Michayla Mcneil is a 29 y.o. [phone] at [redacted]w[redacted]d being seen today for ongoing prenatal care.  She is currently monitored for the following issues for this low-risk pregnancy and has ADD (attention deficit disorder); Anxiety and depression; Bilateral bunions; Family history of ovarian cancer; Tobacco use; Other specified behavioral and emotional disorders with onset usually occurring in childhood and adolescence; Supervision of other normal pregnancy, antepartum; History of recurrent miscarriages; Nausea and vomiting during pregnancy; and Palpitations on their problem list.  ----------------------------------------------------------------------------------- Patient reports no complaints.    . Vag. Bleeding: None.   . Leaking Fluid denies.  ----------------------------------------------------------------------------------- The following portions of the patient's history were reviewed and updated as appropriate: allergies, current medications, past family history, past medical history, past social history, past surgical history and problem list. Problem list updated.  Objective  Blood pressure 125/68, weight 135 lb (61.2 kg), last menstrual period 11/03/2018, unknown if currently breastfeeding. Pregravid weight 127 lb (57.6 kg) Total Weight Gain 8 lb (3.629 kg) Urinalysis: Urine Protein Negative  Urine Glucose Negative  Fetal Status: Fetal Heart Rate (bpm): 150         General:  Alert, oriented and cooperative. Patient is in no acute distress.  Skin: Skin is warm and dry. No rash noted.   Cardiovascular: Normal heart rate noted  Respiratory: Normal respiratory effort, no problems with respiration noted  Abdomen: Soft, gravid, appropriate for gestational age.       Pelvic:  Cervical exam deferred        Extremities: Normal range of motion.     Mental Status: Normal mood and affect. Normal behavior. Normal judgment and thought content.   Assessment   29  y.o. [id number] at [redacted]w[redacted]d by  08/10/2019, by Last Menstrual Period presenting for routine prenatal visit  Plan   Pregnancy#6 Problems (from 11/03/18 to present)    Problem Noted Resolved   Nausea and vomiting during pregnancy 12/31/2018 by Conard Novak, MD No   Palpitations 12/31/2018 by Conard Novak, MD No   Supervision of other normal pregnancy, antepartum 06/23/2018 by Conard Novak, MD No   History of recurrent miscarriages 06/23/2018 by Conard Novak, MD No   ADD (attention deficit disorder) 02/29/2016 by Tommie Sams, DO No       Preterm labor symptoms and general obstetric precautions including but not limited to vaginal bleeding, contractions, leaking of fluid and fetal movement were reviewed in detail with the patient. Please refer to After Visit Summary for other counseling recommendations.   - Corrected rx sent in for Citranatal Assure - Wellbutrin XL 150 mg Rx sent to transition away from Adderall.  Continue adderall wean.   Return in about 3 weeks (around 03/12/2019) for Anatomy u/s and Routine Prenatal Appointment (KP).  Thomasene Mohair, MD, [facility], [facility] 02/19/2019 3:01 PM

## 2019-02-20 NOTE — Telephone Encounter (Signed)
Pharmacist aware Citranatal calm will be fine.

## 2019-02-20 NOTE — Telephone Encounter (Signed)
Ross from [facility] in [place] returning call. The only CitraNatal they have in stock is the CitraNatal Calm.  It seems all other CitraNatal variations have been discontinued.  Call to discuss alternatives.  [phone]

## 2019-03-12 ENCOUNTER — Encounter: Payer: Self-pay | Admitting: Obstetrics and Gynecology

## 2019-03-12 ENCOUNTER — Ambulatory Visit (INDEPENDENT_AMBULATORY_CARE_PROVIDER_SITE_OTHER): Payer: Medicaid Other

## 2019-03-12 ENCOUNTER — Ambulatory Visit (INDEPENDENT_AMBULATORY_CARE_PROVIDER_SITE_OTHER): Payer: Medicaid Other | Admitting: Obstetrics and Gynecology

## 2019-03-12 ENCOUNTER — Other Ambulatory Visit: Payer: Self-pay

## 2019-03-12 VITALS — BP 126/74 | Wt 140.0 lb

## 2019-03-12 DIAGNOSIS — Z3A17 17 weeks gestation of pregnancy: Secondary | ICD-10-CM | POA: Diagnosis not present

## 2019-03-12 DIAGNOSIS — O219 Vomiting of pregnancy, unspecified: Secondary | ICD-10-CM

## 2019-03-12 DIAGNOSIS — Z3480 Encounter for supervision of other normal pregnancy, unspecified trimester: Secondary | ICD-10-CM

## 2019-03-12 DIAGNOSIS — Z363 Encounter for antenatal screening for malformations: Secondary | ICD-10-CM

## 2019-03-12 DIAGNOSIS — F329 Major depressive disorder, single episode, unspecified: Secondary | ICD-10-CM

## 2019-03-12 DIAGNOSIS — O99332 Smoking (tobacco) complicating pregnancy, second trimester: Secondary | ICD-10-CM

## 2019-03-12 DIAGNOSIS — N96 Recurrent pregnancy loss: Secondary | ICD-10-CM

## 2019-03-12 DIAGNOSIS — O2622 Pregnancy care for patient with recurrent pregnancy loss, second trimester: Secondary | ICD-10-CM

## 2019-03-12 DIAGNOSIS — O99342 Other mental disorders complicating pregnancy, second trimester: Secondary | ICD-10-CM

## 2019-03-12 DIAGNOSIS — Z3A18 18 weeks gestation of pregnancy: Secondary | ICD-10-CM

## 2019-03-12 DIAGNOSIS — O09892 Supervision of other high risk pregnancies, second trimester: Secondary | ICD-10-CM

## 2019-03-12 DIAGNOSIS — Z720 Tobacco use: Secondary | ICD-10-CM

## 2019-03-12 DIAGNOSIS — F419 Anxiety disorder, unspecified: Secondary | ICD-10-CM

## 2019-03-12 NOTE — Progress Notes (Signed)
Routine Prenatal Care Visit  Subjective  Sherry Reynolds is a 29 y.o. [phone] at [redacted]w[redacted]d being seen today for ongoing prenatal care.  She is currently monitored for the following issues for this low-risk pregnancy and has ADD (attention deficit disorder); Anxiety and depression; Bilateral bunions; Family history of ovarian cancer; Tobacco use; Other specified behavioral and emotional disorders with onset usually occurring in childhood and adolescence; Supervision of other normal pregnancy, antepartum; History of recurrent miscarriages; Nausea and vomiting during pregnancy; and Palpitations on their problem list.  ----------------------------------------------------------------------------------- Patient reports no complaints.    . Vag. Bleeding: None.  Movement: Present. Leaking Fluid denies.  Anatomy u/s complete today.  ----------------------------------------------------------------------------------- The following portions of the patient's history were reviewed and updated as appropriate: allergies, current medications, past family history, past medical history, past social history, past surgical history and problem list. Problem list updated.  Objective  Blood pressure 126/74, weight 140 lb (63.5 kg), last menstrual period 11/03/2018, unknown if currently breastfeeding. Pregravid weight 127 lb (57.6 kg) Total Weight Gain 13 lb (5.897 kg) Urinalysis: Urine Protein    Urine Glucose    Fetal Status: Fetal Heart Rate (bpm): 155   Movement: Present     General:  Alert, oriented and cooperative. Patient is in no acute distress.  Skin: Skin is warm and dry. No rash noted.   Cardiovascular: Normal heart rate noted  Respiratory: Normal respiratory effort, no problems with respiration noted  Abdomen: Soft, gravid, appropriate for gestational age. Pain/Pressure: Absent     Pelvic:  Cervical exam deferred        Extremities: Normal range of motion.     Mental Status: Normal mood and affect. Normal  behavior. Normal judgment and thought content.   Imaging Results US OB Comp + 14 Wk  Result Date: 03/12/2019 Patient Name: Sherry Reynolds DOB: [birth year] MRN: [id number] ULTRASOUND REPORT Location: [facility] Date of Service: 03/12/2019 Indications:Anatomy Ultrasound Findings: Sherry Reynolds intrauterine pregnancy is visualized with FHR at 155 BPM. Biometrics give an (U/S) Gestational age of [redacted]w[redacted]d and an (U/S) EDD of 08/14/2019; this correlates with the clinically established Estimated Date of Delivery: 08/10/2019 Fetal presentation is Cephalic. EFW: 210 g (7 oz). Placenta: posterior. Grade: 0 AFI: subjectively normal. Anatomic survey is complete and normal; Gender - female.  Impression: 1. [redacted]w[redacted]d Viable Singleton Intrauterine pregnancy by U/S. 2. (U/S) EDD is consistent with Clinically established Estimated Date of Delivery: 08/10/19 . 3. Normal Anatomy Scan Gweneth Dimitri, RT There is a singleton gestation with subjectively normal amniotic fluid volume. The fetal biometry correlates with established dating. Detailed evaluation of the fetal anatomy was performed.The fetal anatomical survey appears within normal limits within the resolution of ultrasound as described above.  It must be noted that a normal ultrasound is unable to rule out fetal aneuploidy nor is it able to detect all possible malformations.   The ultrasound images and findings were reviewed by me and I agree with the above report. Prentice Docker, MD, [facility], [facility] 03/12/2019 10:10 AM       Assessment   29 y.o. [id number] at [redacted]w[redacted]d by  08/10/2019, by Last Menstrual Period presenting for routine prenatal visit  Plan   Pregnancy#6 Problems (from 11/03/18 to present)    Problem Noted Resolved   Nausea and vomiting during pregnancy 12/31/2018 by Will Bonnet, MD No   Palpitations 12/31/2018 by Will Bonnet, MD No   Supervision of other normal pregnancy, antepartum 06/23/2018 by Will Bonnet, MD No  History of recurrent miscarriages 06/23/2018 by Conard Novak, MD No   ADD (attention deficit disorder) 02/29/2016 by Tommie Sams, DO No       Preterm labor symptoms and general obstetric precautions including but not limited to vaginal bleeding, contractions, leaking of fluid and fetal movement were reviewed in detail with the patient. Please refer to After Visit Summary for other counseling recommendations.   The following were addressed during this visit:  Breastfeeding Education - Early initiation of breastfeeding    Comments: Keeps milk supply adequate, helps contract uterus and slow bleeding, and early milk is the perfect first food and is easy to digest.   - The importance of exclusive breastfeeding    Comments: Provides antibodies, Lower risk of breast and ovarian cancers, and type-2 diabetes,Helps your body recover, Reduced chance of SIDS.   - Risks of giving your baby anything other than breast milk if you are breastfeeding    Comments: Make the baby less content with breastfeeds, may make my baby more susceptible to illness, and may reduce my milk supply.   - The importance of early skin-to-skin contact    Comments: Keeps baby warm and secure, helps keep baby's blood sugar up and breathing steady, easier to bond and breastfeed, and helps calm baby.  - Rooming-in on a 24-hour basis    Comments: Easier to learn baby's feeding cues, easier to bond and get to know each other, and encourages milk production.   - Feeding on demand or baby-led feeding    Comments: Helps prevent breastfeeding complications, helps bring in good milk supply, prevents under or overfeeding, and helps baby feel content and satisfied   - Exclusive breastfeeding for the first 6 months    Comments: Builds a healthy milk supply and keeps it up, protects baby from sickness and disease, and breastmilk has everything your baby needs for the first 6 months.    Return in about 4 weeks (around  04/09/2019) for Routine Prenatal Appointment.  Thomasene Mohair, MD, [facility], [facility] 03/12/2019 10:25 AM

## 2019-03-18 ENCOUNTER — Other Ambulatory Visit: Payer: Self-pay | Admitting: Obstetrics and Gynecology

## 2019-03-18 DIAGNOSIS — F988 Other specified behavioral and emotional disorders with onset usually occurring in childhood and adolescence: Secondary | ICD-10-CM

## 2019-03-18 DIAGNOSIS — Z3480 Encounter for supervision of other normal pregnancy, unspecified trimester: Secondary | ICD-10-CM

## 2019-04-07 ENCOUNTER — Encounter: Payer: Self-pay | Admitting: Obstetrics and Gynecology

## 2019-04-07 ENCOUNTER — Other Ambulatory Visit: Payer: Self-pay

## 2019-04-07 ENCOUNTER — Ambulatory Visit (INDEPENDENT_AMBULATORY_CARE_PROVIDER_SITE_OTHER): Payer: Medicaid Other | Admitting: Obstetrics and Gynecology

## 2019-04-07 VITALS — BP 124/78 | Wt 149.0 lb

## 2019-04-07 DIAGNOSIS — Z131 Encounter for screening for diabetes mellitus: Secondary | ICD-10-CM

## 2019-04-07 DIAGNOSIS — N96 Recurrent pregnancy loss: Secondary | ICD-10-CM

## 2019-04-07 DIAGNOSIS — Z113 Encounter for screening for infections with a predominantly sexual mode of transmission: Secondary | ICD-10-CM

## 2019-04-07 DIAGNOSIS — Z3A22 22 weeks gestation of pregnancy: Secondary | ICD-10-CM

## 2019-04-07 DIAGNOSIS — O219 Vomiting of pregnancy, unspecified: Secondary | ICD-10-CM

## 2019-04-07 DIAGNOSIS — Z3480 Encounter for supervision of other normal pregnancy, unspecified trimester: Secondary | ICD-10-CM

## 2019-04-07 NOTE — Progress Notes (Signed)
  Routine Prenatal Care Visit  Subjective  Mackenze Grandison is a 29 y.o. [phone] at [redacted]w[redacted]d being seen today for ongoing prenatal care.  She is currently monitored for the following issues for this low-risk pregnancy and has ADD (attention deficit disorder); Anxiety and depression; Bilateral bunions; Family history of ovarian cancer; Tobacco use; Other specified behavioral and emotional disorders with onset usually occurring in childhood and adolescence; Supervision of other normal pregnancy, antepartum; History of recurrent miscarriages; Nausea and vomiting during pregnancy; and Palpitations on their problem list.  ----------------------------------------------------------------------------------- Patient reports some pelvic pressure with certain movements. Denise urinary, GI, and vaginal symptoms. She declines a pelvic exam after further discussion.     . Vag. Bleeding: None.  Movement: Present. Leaking Fluid denies.  ----------------------------------------------------------------------------------- The following portions of the patient's history were reviewed and updated as appropriate: allergies, current medications, past family history, past medical history, past social history, past surgical history and problem list. Problem list updated.  Objective  Blood pressure 124/78, weight 149 lb (67.6 kg), last menstrual period 11/03/2018, unknown if currently breastfeeding. Pregravid weight 127 lb (57.6 kg) Total Weight Gain 22 lb (9.979 kg) Urinalysis: Urine Protein    Urine Glucose    Fetal Status: Fetal Heart Rate (bpm): 140   Movement: Present     General:  Alert, oriented and cooperative. Patient is in no acute distress.  Skin: Skin is warm and dry. No rash noted.   Cardiovascular: Normal heart rate noted  Respiratory: Normal respiratory effort, no problems with respiration noted  Abdomen: Soft, gravid, appropriate for gestational age. Pain/Pressure: Present     Pelvic:  Cervical exam deferred         Extremities: Normal range of motion.  Edema: Trace  Mental Status: Normal mood and affect. Normal behavior. Normal judgment and thought content.   Assessment   29 y.o. [id number] at [redacted]w[redacted]d by  08/10/2019, by Last Menstrual Period presenting for routine prenatal visit  Plan   Pregnancy#6 Problems (from 11/03/18 to present)    Problem Noted Resolved   Nausea and vomiting during pregnancy 12/31/2018 by Conard Novak, MD No   Palpitations 12/31/2018 by Conard Novak, MD No   Supervision of other normal pregnancy, antepartum 06/23/2018 by Conard Novak, MD No   History of recurrent miscarriages 06/23/2018 by Conard Novak, MD No   ADD (attention deficit disorder) 02/29/2016 by Tommie Sams, DO No       Preterm labor symptoms and general obstetric precautions including but not limited to vaginal bleeding, contractions, leaking of fluid and fetal movement were reviewed in detail with the patient. Please refer to After Visit Summary for other counseling recommendations.   Return in about 4 weeks (around 05/05/2019) for 28 week labs and routine prenatal visit.  Thomasene Mohair, MD, [facility], [facility] 04/07/2019 11:34 AM

## 2019-05-04 ENCOUNTER — Other Ambulatory Visit: Payer: Medicaid Other

## 2019-05-04 ENCOUNTER — Other Ambulatory Visit: Payer: Self-pay

## 2019-05-04 ENCOUNTER — Ambulatory Visit (INDEPENDENT_AMBULATORY_CARE_PROVIDER_SITE_OTHER): Payer: Medicaid Other | Admitting: Obstetrics and Gynecology

## 2019-05-04 ENCOUNTER — Encounter: Payer: Self-pay | Admitting: Obstetrics and Gynecology

## 2019-05-04 VITALS — BP 122/70 | Wt 149.0 lb

## 2019-05-04 DIAGNOSIS — Z3A26 26 weeks gestation of pregnancy: Secondary | ICD-10-CM

## 2019-05-04 DIAGNOSIS — O219 Vomiting of pregnancy, unspecified: Secondary | ICD-10-CM

## 2019-05-04 DIAGNOSIS — Z131 Encounter for screening for diabetes mellitus: Secondary | ICD-10-CM | POA: Diagnosis not present

## 2019-05-04 DIAGNOSIS — Z113 Encounter for screening for infections with a predominantly sexual mode of transmission: Secondary | ICD-10-CM

## 2019-05-04 DIAGNOSIS — Z3480 Encounter for supervision of other normal pregnancy, unspecified trimester: Secondary | ICD-10-CM

## 2019-05-04 DIAGNOSIS — Z3482 Encounter for supervision of other normal pregnancy, second trimester: Secondary | ICD-10-CM

## 2019-05-04 NOTE — Progress Notes (Signed)
  Routine Prenatal Care Visit  Subjective  Sherry Reynolds is a 29 y.o. [phone] at [redacted]w[redacted]d being seen today for ongoing prenatal care.  She is currently monitored for the following issues for this low-risk pregnancy and has ADD (attention deficit disorder); Anxiety and depression; Bilateral bunions; Family history of ovarian cancer; Tobacco use; Other specified behavioral and emotional disorders with onset usually occurring in childhood and adolescence; Supervision of other normal pregnancy, antepartum; History of recurrent miscarriages; Nausea and vomiting during pregnancy; and Palpitations on their problem list.  ----------------------------------------------------------------------------------- Patient reports no complaints.   Contractions: Not present. Vag. Bleeding: None.  Movement: Present. Leaking Fluid denies.  ----------------------------------------------------------------------------------- The following portions of the patient's history were reviewed and updated as appropriate: allergies, current medications, past family history, past medical history, past social history, past surgical history and problem list. Problem list updated.  Objective  Blood pressure 122/70, weight 149 lb (67.6 kg), last menstrual period 11/03/2018, unknown if currently breastfeeding. Pregravid weight 127 lb (57.6 kg) Total Weight Gain 22 lb (9.979 kg) Urinalysis: Urine Protein    Urine Glucose    Fetal Status: Fetal Heart Rate (bpm): 145 Fundal Height: 26 cm Movement: Present     General:  Alert, oriented and cooperative. Patient is in no acute distress.  Skin: Skin is warm and dry. No rash noted.   Cardiovascular: Normal heart rate noted  Respiratory: Normal respiratory effort, no problems with respiration noted  Abdomen: Soft, gravid, appropriate for gestational age. Pain/Pressure: Absent     Pelvic:  Cervical exam deferred        Extremities: Normal range of motion.     Mental Status: Normal mood and  affect. Normal behavior. Normal judgment and thought content.   Assessment   29 y.o. [id number] at [redacted]w[redacted]d by  08/10/2019, by Last Menstrual Period presenting for routine prenatal visit  Plan   Pregnancy#6 Problems (from 11/03/18 to present)    Problem Noted Resolved   Nausea and vomiting during pregnancy 12/31/2018 by Conard Novak, MD No   Palpitations 12/31/2018 by Conard Novak, MD No   Supervision of other normal pregnancy, antepartum 06/23/2018 by Conard Novak, MD No   History of recurrent miscarriages 06/23/2018 by Conard Novak, MD No   ADD (attention deficit disorder) 02/29/2016 by Tommie Sams, DO No       Preterm labor symptoms and general obstetric precautions including but not limited to vaginal bleeding, contractions, leaking of fluid and fetal movement were reviewed in detail with the patient. Please refer to After Visit Summary for other counseling recommendations.   - 28 week labs today  Return in about 4 weeks (around 06/01/2019) for Routine Prenatal Appointment.  Thomasene Mohair, MD, [facility], [facility] 05/04/2019 11:15 AM

## 2019-05-05 LAB — 28 WEEK RH+PANEL
Basophils Absolute: 0 10*3/uL (ref 0.0–0.2)
Basos: 1 %
EOS (ABSOLUTE): 0.1 10*3/uL (ref 0.0–0.4)
Eos: 1 %
Gestational Diabetes Screen: 107 mg/dL (ref 65–139)
HIV Screen 4th Generation wRfx: NONREACTIVE
Hematocrit: 31.9 % — ABNORMAL LOW (ref 34.0–46.6)
Hemoglobin: 11.1 g/dL (ref 11.1–15.9)
Immature Grans (Abs): 0 10*3/uL (ref 0.0–0.1)
Immature Granulocytes: 1 %
Lymphocytes Absolute: 0.9 10*3/uL (ref 0.7–3.1)
Lymphs: 17 %
MCH: 31.9 pg (ref 26.6–33.0)
MCHC: 34.8 g/dL (ref 31.5–35.7)
MCV: 92 fL (ref 79–97)
Monocytes Absolute: 0.4 10*3/uL (ref 0.1–0.9)
Monocytes: 7 %
Neutrophils Absolute: 3.9 10*3/uL (ref 1.4–7.0)
Neutrophils: 73 %
Platelets: 180 10*3/uL (ref 150–450)
RBC: 3.48 x10E6/uL — ABNORMAL LOW (ref 3.77–5.28)
RDW: 11.9 % (ref 11.7–15.4)
RPR Ser Ql: NONREACTIVE
WBC: 5.3 10*3/uL (ref 3.4–10.8)

## 2019-06-02 ENCOUNTER — Ambulatory Visit (INDEPENDENT_AMBULATORY_CARE_PROVIDER_SITE_OTHER): Payer: Medicaid Other | Admitting: Obstetrics and Gynecology

## 2019-06-02 ENCOUNTER — Other Ambulatory Visit: Payer: Self-pay

## 2019-06-02 ENCOUNTER — Encounter: Payer: Self-pay | Admitting: Obstetrics and Gynecology

## 2019-06-02 VITALS — BP 122/72 | Wt 156.0 lb

## 2019-06-02 DIAGNOSIS — Z3480 Encounter for supervision of other normal pregnancy, unspecified trimester: Secondary | ICD-10-CM

## 2019-06-02 DIAGNOSIS — Z3A30 30 weeks gestation of pregnancy: Secondary | ICD-10-CM

## 2019-06-02 NOTE — Progress Notes (Signed)
  Routine Prenatal Care Visit  Subjective  Sherry Reynolds is a 29 y.o. [phone] at [redacted]w[redacted]d being seen today for ongoing prenatal care.  She is currently monitored for the following issues for this low-risk pregnancy and has ADD (attention deficit disorder); Anxiety and depression; Bilateral bunions; Family history of ovarian cancer; Tobacco use; Other specified behavioral and emotional disorders with onset usually occurring in childhood and adolescence; Supervision of other normal pregnancy, antepartum; History of recurrent miscarriages; Nausea and vomiting during pregnancy; and Palpitations on their problem list.  ----------------------------------------------------------------------------------- Patient reports no complaints.   Contractions: Not present. Vag. Bleeding: None.  Movement: Present. Leaking Fluid denies.  ----------------------------------------------------------------------------------- The following portions of the patient's history were reviewed and updated as appropriate: allergies, current medications, past family history, past medical history, past social history, past surgical history and problem list. Problem list updated.  Objective  Blood pressure 122/72, weight 156 lb (70.8 kg), last menstrual period 11/03/2018, unknown if currently breastfeeding. Pregravid weight 127 lb (57.6 kg) Total Weight Gain 29 lb (13.2 kg) Urinalysis: Urine Protein    Urine Glucose    Fetal Status: Fetal Heart Rate (bpm): 140 Fundal Height: 30 cm Movement: Present     General:  Alert, oriented and cooperative. Patient is in no acute distress.  Skin: Skin is warm and dry. No rash noted.   Cardiovascular: Normal heart rate noted  Respiratory: Normal respiratory effort, no problems with respiration noted  Abdomen: Soft, gravid, appropriate for gestational age. Pain/Pressure: Absent     Pelvic:  Cervical exam deferred        Extremities: Normal range of motion.     Mental Status: Normal mood and  affect. Normal behavior. Normal judgment and thought content.   Assessment   29 y.o. [id number] at [redacted]w[redacted]d by  08/10/2019, by Last Menstrual Period presenting for routine prenatal visit  Plan   Pregnancy#6 Problems (from 11/03/18 to present)    Problem Noted Resolved   Nausea and vomiting during pregnancy 12/31/2018 by Conard Novak, MD No   Palpitations 12/31/2018 by Conard Novak, MD No   Supervision of other normal pregnancy, antepartum 06/23/2018 by Conard Novak, MD No   History of recurrent miscarriages 06/23/2018 by Conard Novak, MD No   ADD (attention deficit disorder) 02/29/2016 by Tommie Sams, DO No       Preterm labor symptoms and general obstetric precautions including but not limited to vaginal bleeding, contractions, leaking of fluid and fetal movement were reviewed in detail with the patient. Please refer to After Visit Summary for other counseling recommendations.   Return in about 2 weeks (around 06/16/2019) for Routine Prenatal Appointment (may make multiple appts).  Thomasene Mohair, MD, [facility], [facility] 06/02/2019 10:24 AM

## 2019-06-16 ENCOUNTER — Ambulatory Visit (INDEPENDENT_AMBULATORY_CARE_PROVIDER_SITE_OTHER): Payer: Medicaid Other | Admitting: Obstetrics and Gynecology

## 2019-06-16 ENCOUNTER — Encounter: Payer: Self-pay | Admitting: Obstetrics and Gynecology

## 2019-06-16 ENCOUNTER — Other Ambulatory Visit: Payer: Self-pay

## 2019-06-16 VITALS — BP 126/84 | Wt 157.0 lb

## 2019-06-16 DIAGNOSIS — Z23 Encounter for immunization: Secondary | ICD-10-CM

## 2019-06-16 DIAGNOSIS — Z3A32 32 weeks gestation of pregnancy: Secondary | ICD-10-CM

## 2019-06-16 DIAGNOSIS — Z3480 Encounter for supervision of other normal pregnancy, unspecified trimester: Secondary | ICD-10-CM

## 2019-06-16 NOTE — Progress Notes (Signed)
  Routine Prenatal Care Visit  Subjective  Sherry Reynolds is a 29 y.o. [phone] at [redacted]w[redacted]d being seen today for ongoing prenatal care.  She is currently monitored for the following issues for this low-risk pregnancy and has ADD (attention deficit disorder); Anxiety and depression; Bilateral bunions; Family history of ovarian cancer; Tobacco use; Other specified behavioral and emotional disorders with onset usually occurring in childhood and adolescence; Supervision of other normal pregnancy, antepartum; History of recurrent miscarriages; Nausea and vomiting during pregnancy; and Palpitations on their problem list.  ----------------------------------------------------------------------------------- Patient reports no complaints.   Contractions: Not present. Vag. Bleeding: None.  Movement: Present. Leaking Fluid denies.  ----------------------------------------------------------------------------------- The following portions of the patient's history were reviewed and updated as appropriate: allergies, current medications, past family history, past medical history, past social history, past surgical history and problem list. Problem list updated.  Objective  Blood pressure 126/84, weight 157 lb (71.2 kg), last menstrual period 11/03/2018, unknown if currently breastfeeding. Pregravid weight 127 lb (57.6 kg) Total Weight Gain 30 lb (13.6 kg) Urinalysis: Urine Protein    Urine Glucose    Fetal Status: Fetal Heart Rate (bpm): 135 Fundal Height: 32 cm Movement: Present     General:  Alert, oriented and cooperative. Patient is in no acute distress.  Skin: Skin is warm and dry. No rash noted.   Cardiovascular: Normal heart rate noted  Respiratory: Normal respiratory effort, no problems with respiration noted  Abdomen: Soft, gravid, appropriate for gestational age. Pain/Pressure: Absent     Pelvic:  Cervical exam deferred        Extremities: Normal range of motion.     Mental Status: Normal mood and  affect. Normal behavior. Normal judgment and thought content.   Assessment   29 y.o. [id number] at [redacted]w[redacted]d by  08/10/2019, by Last Menstrual Period presenting for routine prenatal visit  Plan   Pregnancy#6 Problems (from 11/03/18 to present)    Problem Noted Resolved   Nausea and vomiting during pregnancy 12/31/2018 by Conard Novak, MD No   Palpitations 12/31/2018 by Conard Novak, MD No   Supervision of other normal pregnancy, antepartum 06/23/2018 by Conard Novak, MD No   History of recurrent miscarriages 06/23/2018 by Conard Novak, MD No   ADD (attention deficit disorder) 02/29/2016 by Tommie Sams, DO No       Preterm labor symptoms and general obstetric precautions including but not limited to vaginal bleeding, contractions, leaking of fluid and fetal movement were reviewed in detail with the patient. Please refer to After Visit Summary for other counseling recommendations.   Return in about 2 weeks (around 06/30/2019) for Routine Prenatal Appointment.  Thomasene Mohair, MD, [facility], [facility] 06/16/2019 5:09 PM

## 2019-06-17 NOTE — Addendum Note (Signed)
Addended by: Liliane Shi on: 06/17/2019 10:20 AM   Modules accepted: Orders

## 2019-06-30 ENCOUNTER — Encounter: Payer: Self-pay | Admitting: Obstetrics and Gynecology

## 2019-06-30 ENCOUNTER — Other Ambulatory Visit: Payer: Self-pay

## 2019-06-30 ENCOUNTER — Ambulatory Visit (INDEPENDENT_AMBULATORY_CARE_PROVIDER_SITE_OTHER): Payer: Medicaid Other | Admitting: Obstetrics and Gynecology

## 2019-06-30 VITALS — BP 120/74 | Wt 159.0 lb

## 2019-06-30 DIAGNOSIS — Z3480 Encounter for supervision of other normal pregnancy, unspecified trimester: Secondary | ICD-10-CM

## 2019-06-30 DIAGNOSIS — Z3A34 34 weeks gestation of pregnancy: Secondary | ICD-10-CM

## 2019-06-30 NOTE — Progress Notes (Signed)
  Routine Prenatal Care Visit  Subjective  Sherry Reynolds is a 29 y.o. [phone] at [redacted]w[redacted]d being seen today for ongoing prenatal care.  She is currently monitored for the following issues for this low-risk pregnancy and has ADD (attention deficit disorder); Anxiety and depression; Bilateral bunions; Family history of ovarian cancer; Tobacco use; Other specified behavioral and emotional disorders with onset usually occurring in childhood and adolescence; Supervision of other normal pregnancy, antepartum; History of recurrent miscarriages; Nausea and vomiting during pregnancy; and Palpitations on their problem list.  ----------------------------------------------------------------------------------- Patient reports no complaints.   Contractions: Not present. Vag. Bleeding: None.  Movement: Present. Leaking Fluid denies.  ----------------------------------------------------------------------------------- The following portions of the patient's history were reviewed and updated as appropriate: allergies, current medications, past family history, past medical history, past social history, past surgical history and problem list. Problem list updated.  Objective  Blood pressure 120/74, weight 159 lb (72.1 kg), last menstrual period 11/03/2018, unknown if currently breastfeeding. Pregravid weight 127 lb (57.6 kg) Total Weight Gain 32 lb (14.5 kg) Urinalysis: Urine Protein    Urine Glucose    Fetal Status: Fetal Heart Rate (bpm): 145 Fundal Height: 34 cm Movement: Present     General:  Alert, oriented and cooperative. Patient is in no acute distress.  Skin: Skin is warm and dry. No rash noted.   Cardiovascular: Normal heart rate noted  Respiratory: Normal respiratory effort, no problems with respiration noted  Abdomen: Soft, gravid, appropriate for gestational age. Pain/Pressure: Present     Pelvic:  Cervical exam deferred        Extremities: Normal range of motion.     Mental Status: Normal mood and  affect. Normal behavior. Normal judgment and thought content.   Assessment   29 y.o. [id number] at [redacted]w[redacted]d by  08/10/2019, by Last Menstrual Period presenting for routine prenatal visit  Plan   Pregnancy#6 Problems (from 11/03/18 to present)    Problem Noted Resolved   Nausea and vomiting during pregnancy 12/31/2018 by Conard Novak, MD No   Palpitations 12/31/2018 by Conard Novak, MD No   Supervision of other normal pregnancy, antepartum 06/23/2018 by Conard Novak, MD No   History of recurrent miscarriages 06/23/2018 by Conard Novak, MD No   ADD (attention deficit disorder) 02/29/2016 by Tommie Sams, DO No       Preterm labor symptoms and general obstetric precautions including but not limited to vaginal bleeding, contractions, leaking of fluid and fetal movement were reviewed in detail with the patient. Please refer to After Visit Summary for other counseling recommendations.   Return in about 3 weeks (around 07/21/2019) for Routine Prenatal Appointment (may make multiple weekly apps after next).  Sherry Mohair, MD, [facility], [facility] 06/30/2019 10:37 AM

## 2019-07-14 ENCOUNTER — Other Ambulatory Visit (HOSPITAL_COMMUNITY)
Admission: RE | Admit: 2019-07-14 | Discharge: 2019-07-14 | Disposition: A | Discharge status: Home / Self Care | Payer: Medicaid Other | Source: Ambulatory Visit | Attending: Obstetrics and Gynecology | Admitting: Obstetrics and Gynecology

## 2019-07-14 ENCOUNTER — Encounter: Payer: Self-pay | Admitting: Obstetrics and Gynecology

## 2019-07-14 ENCOUNTER — Other Ambulatory Visit: Payer: Self-pay

## 2019-07-14 ENCOUNTER — Ambulatory Visit (INDEPENDENT_AMBULATORY_CARE_PROVIDER_SITE_OTHER): Payer: Medicaid Other | Admitting: Obstetrics and Gynecology

## 2019-07-14 VITALS — BP 122/74 | Wt 162.0 lb

## 2019-07-14 DIAGNOSIS — Z3A36 36 weeks gestation of pregnancy: Secondary | ICD-10-CM

## 2019-07-14 DIAGNOSIS — O219 Vomiting of pregnancy, unspecified: Secondary | ICD-10-CM

## 2019-07-14 DIAGNOSIS — Z3480 Encounter for supervision of other normal pregnancy, unspecified trimester: Secondary | ICD-10-CM

## 2019-07-14 NOTE — Progress Notes (Signed)
  Routine Prenatal Care Visit  Subjective  Sherry Reynolds is a 29 y.o. [phone] at [redacted]w[redacted]d being seen today for ongoing prenatal care.  She is currently monitored for the following issues for this low-risk pregnancy and has ADD (attention deficit disorder); Anxiety and depression; Bilateral bunions; Family history of ovarian cancer; Tobacco use; Other specified behavioral and emotional disorders with onset usually occurring in childhood and adolescence; Supervision of other normal pregnancy, antepartum; History of recurrent miscarriages; Nausea and vomiting during pregnancy; and Palpitations on their problem list.  ----------------------------------------------------------------------------------- Patient reports no complaints.   Contractions: Not present. Vag. Bleeding: None.  Movement: Present. Leaking Fluid denies.  ----------------------------------------------------------------------------------- The following portions of the patient's history were reviewed and updated as appropriate: allergies, current medications, past family history, past medical history, past social history, past surgical history and problem list. Problem list updated.  Objective  Blood pressure 122/74, weight 162 lb (73.5 kg), last menstrual period 11/03/2018, unknown if currently breastfeeding. Pregravid weight 127 lb (57.6 kg) Total Weight Gain 35 lb (15.9 kg) Urinalysis: Urine Protein    Urine Glucose    Fetal Status: Fetal Heart Rate (bpm): 125 Fundal Height: 35 cm Movement: Present  Presentation: Vertex  General:  Alert, oriented and cooperative. Patient is in no acute distress.  Skin: Skin is warm and dry. No rash noted.   Cardiovascular: Normal heart rate noted  Respiratory: Normal respiratory effort, no problems with respiration noted  Abdomen: Soft, gravid, appropriate for gestational age. Pain/Pressure: Present     Pelvic:  Cervical exam performed Dilation: Closed Effacement (%): 40 Station: -3  Extremities:  Normal range of motion.  Edema: None  Mental Status: Normal mood and affect. Normal behavior. Normal judgment and thought content.   BSUS: cephalic, EFW (rough) 6lb 3 oz, fluid subjectively normal.   Assessment   29 y.o. [id number] at [redacted]w[redacted]d by  08/10/2019, by Last Menstrual Period presenting for routine prenatal visit  Plan   Pregnancy#6 Problems (from 11/03/18 to present)    Problem Noted Resolved   Nausea and vomiting during pregnancy 12/31/2018 by Conard Novak, MD No   Palpitations 12/31/2018 by Conard Novak, MD No   Supervision of other normal pregnancy, antepartum 06/23/2018 by Conard Novak, MD No   History of recurrent miscarriages 06/23/2018 by Conard Novak, MD No   ADD (attention deficit disorder) 02/29/2016 by Tommie Sams, DO No       Preterm labor symptoms and general obstetric precautions including but not limited to vaginal bleeding, contractions, leaking of fluid and fetal movement were reviewed in detail with the patient. Please refer to After Visit Summary for other counseling recommendations.   - GBS/aptima today  Return in about 1 week (around 07/21/2019) for Routine Prenatal Appointment.  Thomasene Mohair, MD, [facility], [facility] 07/14/2019 11:03 AM

## 2019-07-15 LAB — CERVICOVAGINAL ANCILLARY ONLY
Chlamydia: NEGATIVE
Comment: NEGATIVE
Comment: NORMAL
Neisseria Gonorrhea: NEGATIVE

## 2019-07-16 LAB — STREP GP B NAA: Strep Gp B NAA: POSITIVE — AB

## 2019-07-20 ENCOUNTER — Other Ambulatory Visit: Payer: Self-pay

## 2019-07-20 ENCOUNTER — Encounter: Payer: Self-pay | Admitting: Obstetrics and Gynecology

## 2019-07-20 ENCOUNTER — Ambulatory Visit (INDEPENDENT_AMBULATORY_CARE_PROVIDER_SITE_OTHER): Payer: Medicaid Other | Admitting: Obstetrics and Gynecology

## 2019-07-20 VITALS — BP 124/74 | Wt 163.0 lb

## 2019-07-20 DIAGNOSIS — Z3A37 37 weeks gestation of pregnancy: Secondary | ICD-10-CM

## 2019-07-20 DIAGNOSIS — Z3483 Encounter for supervision of other normal pregnancy, third trimester: Secondary | ICD-10-CM

## 2019-07-20 DIAGNOSIS — N96 Recurrent pregnancy loss: Secondary | ICD-10-CM

## 2019-07-20 DIAGNOSIS — O219 Vomiting of pregnancy, unspecified: Secondary | ICD-10-CM

## 2019-07-20 NOTE — Progress Notes (Signed)
  Routine Prenatal Care Visit  Subjective  Sherry Reynolds is a 29 y.o. [phone] at [redacted]w[redacted]d being seen today for ongoing prenatal care.  She is currently monitored for the following issues for this low-risk pregnancy and has ADD (attention deficit disorder); Anxiety and depression; Bilateral bunions; Family history of ovarian cancer; Tobacco use; Other specified behavioral and emotional disorders with onset usually occurring in childhood and adolescence; Supervision of other normal pregnancy, antepartum; History of recurrent miscarriages; Nausea and vomiting during pregnancy; and Palpitations on their problem list.  ----------------------------------------------------------------------------------- Patient reports no complaints.   Contractions: Irritability. Vag. Bleeding: None.  Movement: Present. Leaking Fluid denies.  ----------------------------------------------------------------------------------- The following portions of the patient's history were reviewed and updated as appropriate: allergies, current medications, past family history, past medical history, past social history, past surgical history and problem list. Problem list updated.  Objective  Blood pressure 124/74, weight 163 lb (73.9 kg), last menstrual period 11/03/2018, unknown if currently breastfeeding. Pregravid weight 127 lb (57.6 kg) Total Weight Gain 36 lb (16.3 kg) Urinalysis: Urine Protein    Urine Glucose    Fetal Status: Fetal Heart Rate (bpm): 140 Fundal Height: 36 cm Movement: Present     General:  Alert, oriented and cooperative. Patient is in no acute distress.  Skin: Skin is warm and dry. No rash noted.   Cardiovascular: Normal heart rate noted  Respiratory: Normal respiratory effort, no problems with respiration noted  Abdomen: Soft, gravid, appropriate for gestational age. Pain/Pressure: Present     Pelvic:  Cervical exam deferred        Extremities: Normal range of motion.     Mental Status: Normal mood and  affect. Normal behavior. Normal judgment and thought content.   Assessment   28 y.o. [id number] at [redacted]w[redacted]d by  08/10/2019, by Last Menstrual Period presenting for routine prenatal visit  Plan   Pregnancy#6 Problems (from 11/03/18 to present)    Problem Noted Resolved   Nausea and vomiting during pregnancy 12/31/2018 by Conard Novak, MD No   Palpitations 12/31/2018 by Conard Novak, MD No   Supervision of other normal pregnancy, antepartum 06/23/2018 by Conard Novak, MD No   History of recurrent miscarriages 06/23/2018 by Conard Novak, MD No   ADD (attention deficit disorder) 02/29/2016 by Tommie Sams, DO No       Term labor symptoms and general obstetric precautions including but not limited to vaginal bleeding, contractions, leaking of fluid and fetal movement were reviewed in detail with the patient. Please refer to After Visit Summary for other counseling recommendations.   Return in about 1 week (around 07/27/2019) for Routine Prenatal Appointment and on 7/23 w SDJ (double overbook).  Thomasene Mohair, MD, [facility], [facility] 07/20/2019 10:55 AM

## 2019-07-27 ENCOUNTER — Encounter: Payer: Self-pay | Admitting: Obstetrics and Gynecology

## 2019-07-27 ENCOUNTER — Ambulatory Visit (INDEPENDENT_AMBULATORY_CARE_PROVIDER_SITE_OTHER): Payer: Medicaid Other | Admitting: Obstetrics and Gynecology

## 2019-07-27 ENCOUNTER — Other Ambulatory Visit: Payer: Self-pay

## 2019-07-27 VITALS — BP 126/74 | Wt 164.0 lb

## 2019-07-27 DIAGNOSIS — Z3480 Encounter for supervision of other normal pregnancy, unspecified trimester: Secondary | ICD-10-CM

## 2019-07-27 DIAGNOSIS — O219 Vomiting of pregnancy, unspecified: Secondary | ICD-10-CM

## 2019-07-27 DIAGNOSIS — Z3A38 38 weeks gestation of pregnancy: Secondary | ICD-10-CM

## 2019-07-27 DIAGNOSIS — N96 Recurrent pregnancy loss: Secondary | ICD-10-CM

## 2019-07-27 NOTE — Progress Notes (Signed)
°  Routine Prenatal Care Visit  Subjective  Sherry Reynolds is a 29 y.o. [phone] at [redacted]w[redacted]d being seen today for ongoing prenatal care.  She is currently monitored for the following issues for this low-risk pregnancy and has ADD (attention deficit disorder); Anxiety and depression; Bilateral bunions; Family history of ovarian cancer; Tobacco use; Other specified behavioral and emotional disorders with onset usually occurring in childhood and adolescence; Supervision of other normal pregnancy, antepartum; History of recurrent miscarriages; Nausea and vomiting during pregnancy; and Palpitations on their problem list.  ----------------------------------------------------------------------------------- Patient reports no complaints.   Contractions: Irritability. Vag. Bleeding: None.  Movement: Present. Leaking Fluid denies.  ----------------------------------------------------------------------------------- The following portions of the patient's history were reviewed and updated as appropriate: allergies, current medications, past family history, past medical history, past social history, past surgical history and problem list. Problem list updated.  Objective  Blood pressure 126/74, weight 164 lb (74.4 kg), last menstrual period 11/03/2018, unknown if currently breastfeeding. Pregravid weight 127 lb (57.6 kg) Total Weight Gain 37 lb (16.8 kg) Urinalysis: Urine Protein    Urine Glucose    Fetal Status: Fetal Heart Rate (bpm): 130 Fundal Height: 38 cm Movement: Present  Presentation: Vertex  General:  Alert, oriented and cooperative. Patient is in no acute distress.  Skin: Skin is warm and dry. No rash noted.   Cardiovascular: Normal heart rate noted  Respiratory: Normal respiratory effort, no problems with respiration noted  Abdomen: Soft, gravid, appropriate for gestational age. Pain/Pressure: Present     Pelvic:  Cervical exam deferred        Extremities: Normal range of motion.  Edema: None   Mental Status: Normal mood and affect. Normal behavior. Normal judgment and thought content.   NST performed due to question of low fetal heart rate intermittently.  Baseline FHR: 130 beats/min Variability: moderate Accelerations: present Decelerations: absent Tocometry: not done  Interpretation:  INDICATIONS: question of low fetal heart rate, decelerations RESULTS:  A NST procedure was performed with FHR monitoring and a normal baseline established, appropriate time of 20-40 minutes of evaluation, and accels >2 seen w 15x15 characteristics.  Results show a REACTIVE NST.    Assessment   29 y.o. [id number] at [redacted]w[redacted]d by  08/10/2019, by Last Menstrual Period presenting for routine prenatal visit  Plan   Pregnancy#6 Problems (from 11/03/18 to present)    Problem Noted Resolved   Nausea and vomiting during pregnancy 12/31/2018 by Conard Novak, MD No   Palpitations 12/31/2018 by Conard Novak, MD No   Supervision of other normal pregnancy, antepartum 06/23/2018 by Conard Novak, MD No   History of recurrent miscarriages 06/23/2018 by Conard Novak, MD No   ADD (attention deficit disorder) 02/29/2016 by Tommie Sams, DO No       Term labor symptoms and general obstetric precautions including but not limited to vaginal bleeding, contractions, leaking of fluid and fetal movement were reviewed in detail with the patient. Please refer to After Visit Summary for other counseling recommendations.   NST reactive over 30 minutes.  No bradycardia or decelerations.  Fetal movement palpable throughout entire visit. Reassurance given.   Return in about 4 days (around 07/31/2019) for Routine Prenatal Appointment.  Thomasene Mohair, MD, [facility], [facility] 07/27/2019 12:51 PM

## 2019-07-31 ENCOUNTER — Other Ambulatory Visit: Payer: Self-pay

## 2019-07-31 ENCOUNTER — Encounter: Payer: Self-pay | Admitting: Obstetrics and Gynecology

## 2019-07-31 ENCOUNTER — Ambulatory Visit (INDEPENDENT_AMBULATORY_CARE_PROVIDER_SITE_OTHER): Payer: Medicaid Other | Admitting: Obstetrics and Gynecology

## 2019-07-31 VITALS — BP 122/74 | Wt 165.0 lb

## 2019-07-31 DIAGNOSIS — Z3A38 38 weeks gestation of pregnancy: Secondary | ICD-10-CM

## 2019-07-31 DIAGNOSIS — Z3483 Encounter for supervision of other normal pregnancy, third trimester: Secondary | ICD-10-CM

## 2019-07-31 DIAGNOSIS — N96 Recurrent pregnancy loss: Secondary | ICD-10-CM

## 2019-07-31 DIAGNOSIS — O219 Vomiting of pregnancy, unspecified: Secondary | ICD-10-CM

## 2019-07-31 NOTE — Progress Notes (Signed)
  Routine Prenatal Care Visit  Subjective  Sherry Reynolds is a 29 y.o. [phone] at [redacted]w[redacted]d being seen today for ongoing prenatal care.  She is currently monitored for the following issues for this low-risk pregnancy and has ADD (attention deficit disorder); Anxiety and depression; Bilateral bunions; Family history of ovarian cancer; Tobacco use; Other specified behavioral and emotional disorders with onset usually occurring in childhood and adolescence; Supervision of other normal pregnancy, antepartum; History of recurrent miscarriages; Nausea and vomiting during pregnancy; and Palpitations on their problem list.  ----------------------------------------------------------------------------------- Patient reports no complaints.   Contractions: Irregular. Vag. Bleeding: None.  Movement: Present. Leaking Fluid denies.  ----------------------------------------------------------------------------------- The following portions of the patient's history were reviewed and updated as appropriate: allergies, current medications, past family history, past medical history, past social history, past surgical history and problem list. Problem list updated.  Objective  Blood pressure 122/74, weight 165 lb (74.8 kg), last menstrual period 11/03/2018, unknown if currently breastfeeding. Pregravid weight 127 lb (57.6 kg) Total Weight Gain 38 lb (17.2 kg) Urinalysis: Urine Protein    Urine Glucose    Fetal Status: Fetal Heart Rate (bpm): 125   Movement: Present  Presentation: Vertex  General:  Alert, oriented and cooperative. Patient is in no acute distress.  Skin: Skin is warm and dry. No rash noted.   Cardiovascular: Normal heart rate noted  Respiratory: Normal respiratory effort, no problems with respiration noted  Abdomen: Soft, gravid, appropriate for gestational age. Pain/Pressure: Present     Pelvic:  Cervical exam performed Dilation: Closed Effacement (%): 30 Station: -2  Extremities: Normal range of  motion.     Mental Status: Normal mood and affect. Normal behavior. Normal judgment and thought content.   Assessment   29 y.o. [id number] at [redacted]w[redacted]d by  08/10/2019, by Last Menstrual Period presenting for routine prenatal visit  Plan   Pregnancy#6 Problems (from 11/03/18 to present)    Problem Noted Resolved   Nausea and vomiting during pregnancy 12/31/2018 by Conard Novak, MD No   Palpitations 12/31/2018 by Conard Novak, MD No   Supervision of other normal pregnancy, antepartum 06/23/2018 by Conard Novak, MD No   History of recurrent miscarriages 06/23/2018 by Conard Novak, MD No   ADD (attention deficit disorder) 02/29/2016 by Tommie Sams, DO No       Term labor symptoms and general obstetric precautions including but not limited to vaginal bleeding, contractions, leaking of fluid and fetal movement were reviewed in detail with the patient. Please refer to After Visit Summary for other counseling recommendations.   Return in about 1 week (around 08/07/2019) for Routine Prenatal Appointment.  Thomasene Mohair, MD, [facility], [facility] 07/31/2019 9:49 AM

## 2019-08-10 ENCOUNTER — Other Ambulatory Visit: Payer: Self-pay

## 2019-08-10 ENCOUNTER — Ambulatory Visit (INDEPENDENT_AMBULATORY_CARE_PROVIDER_SITE_OTHER): Payer: Medicaid Other | Admitting: Obstetrics and Gynecology

## 2019-08-10 ENCOUNTER — Encounter: Payer: Self-pay | Admitting: Obstetrics and Gynecology

## 2019-08-10 VITALS — BP 122/70 | Wt 168.0 lb

## 2019-08-10 DIAGNOSIS — N96 Recurrent pregnancy loss: Secondary | ICD-10-CM

## 2019-08-10 DIAGNOSIS — Z3483 Encounter for supervision of other normal pregnancy, third trimester: Secondary | ICD-10-CM

## 2019-08-10 DIAGNOSIS — Z3A40 40 weeks gestation of pregnancy: Secondary | ICD-10-CM

## 2019-08-10 NOTE — Progress Notes (Signed)
°  Routine Prenatal Care Visit  Subjective  Sherry Reynolds is a 29 y.o. [phone] at 100w0d being seen today for ongoing prenatal care.  She is currently monitored for the following issues for this low-risk pregnancy and has ADD (attention deficit disorder); Anxiety and depression; Bilateral bunions; Family history of ovarian cancer; Tobacco use; Other specified behavioral and emotional disorders with onset usually occurring in childhood and adolescence; Supervision of other normal pregnancy, antepartum; History of recurrent miscarriages; Nausea and vomiting during pregnancy; and Palpitations on their problem list.  ----------------------------------------------------------------------------------- Patient reports no complaints.   Contractions: Irregular. Vag. Bleeding: None.  Movement: Present. Leaking Fluid denies.  ----------------------------------------------------------------------------------- The following portions of the patient's history were reviewed and updated as appropriate: allergies, current medications, past family history, past medical history, past social history, past surgical history and problem list. Problem list updated.  Objective  Blood pressure 122/70, weight 168 lb (76.2 kg), last menstrual period 11/03/2018, unknown if currently breastfeeding. Pregravid weight 127 lb (57.6 kg) Total Weight Gain 41 lb (18.6 kg) Urinalysis: Urine Protein    Urine Glucose    Fetal Status: Fetal Heart Rate (bpm): 145   Movement: Present  Presentation: Vertex  General:  Alert, oriented and cooperative. Patient is in no acute distress.  Skin: Skin is warm and dry. No rash noted.   Cardiovascular: Normal heart rate noted  Respiratory: Normal respiratory effort, no problems with respiration noted  Abdomen: Soft, gravid, appropriate for gestational age. Pain/Pressure: Present     Pelvic:  Cervical exam deferred Dilation: 1 Effacement (%): 40 Station: -2  Extremities: Normal range of motion.      Mental Status: Normal mood and affect. Normal behavior. Normal judgment and thought content.   Assessment   29 y.o. [id number] at [redacted]w[redacted]d by  08/10/2019, by Last Menstrual Period presenting for routine prenatal visit  Plan   Pregnancy#6 Problems (from 11/03/18 to present)    Problem Noted Resolved   Nausea and vomiting during pregnancy 12/31/2018 by Conard Novak, MD No   Palpitations 12/31/2018 by Conard Novak, MD No   Supervision of other normal pregnancy, antepartum 06/23/2018 by Conard Novak, MD No   History of recurrent miscarriages 06/23/2018 by Conard Novak, MD No   ADD (attention deficit disorder) 02/29/2016 by Tommie Sams, DO No       Term labor symptoms and general obstetric precautions including but not limited to vaginal bleeding, contractions, leaking of fluid and fetal movement were reviewed in detail with the patient. Please refer to After Visit Summary for other counseling recommendations.   Return in about 3 days (around 08/13/2019) for IOL at noon, COVID testing on 8/3 at [facility].  Thomasene Mohair, MD, [facility], [facility] 08/10/2019 12:26 PM

## 2019-08-10 NOTE — Patient Instructions (Signed)
COVID19 test: 08/11/19 between 9-10 AM at [facility]. Drive up to circle, stay in care, leave on mask. Induction at 12:00 PM on 8/5. Go through ER at noon. They will take you to L&D.

## 2019-08-11 ENCOUNTER — Other Ambulatory Visit
Admission: RE | Admit: 2019-08-11 | Discharge: 2019-08-11 | Disposition: A | Discharge status: Home / Self Care | Payer: Medicaid Other | Source: Ambulatory Visit | Attending: Obstetrics and Gynecology | Admitting: Obstetrics and Gynecology

## 2019-08-11 DIAGNOSIS — Z20822 Contact with and (suspected) exposure to covid-19: Secondary | ICD-10-CM | POA: Insufficient documentation

## 2019-08-11 DIAGNOSIS — Z01812 Encounter for preprocedural laboratory examination: Secondary | ICD-10-CM | POA: Diagnosis not present

## 2019-08-11 LAB — SARS CORONAVIRUS 2 (TAT 6-24 HRS): SARS Coronavirus 2: NEGATIVE

## 2019-08-13 ENCOUNTER — Other Ambulatory Visit: Payer: Self-pay

## 2019-08-13 ENCOUNTER — Inpatient Hospital Stay
Admission: EM | Admit: 2019-08-13 | Discharge: 2019-08-14 | DRG: 807 | Disposition: A | Discharge status: Home / Self Care | Payer: Medicaid Other | Attending: Obstetrics and Gynecology | Admitting: Obstetrics and Gynecology

## 2019-08-13 ENCOUNTER — Encounter: Payer: Self-pay | Admitting: Obstetrics & Gynecology

## 2019-08-13 ENCOUNTER — Inpatient Hospital Stay: Payer: Medicaid Other | Admitting: Anesthesiology

## 2019-08-13 DIAGNOSIS — O4290 Premature rupture of membranes, unspecified as to length of time between rupture and onset of labor, unspecified weeks of gestation: Secondary | ICD-10-CM | POA: Diagnosis not present

## 2019-08-13 DIAGNOSIS — O26893 Other specified pregnancy related conditions, third trimester: Secondary | ICD-10-CM | POA: Diagnosis present

## 2019-08-13 DIAGNOSIS — O99334 Smoking (tobacco) complicating childbirth: Secondary | ICD-10-CM | POA: Diagnosis present

## 2019-08-13 DIAGNOSIS — R000 Tachycardia, unspecified: Secondary | ICD-10-CM | POA: Diagnosis present

## 2019-08-13 DIAGNOSIS — O219 Vomiting of pregnancy, unspecified: Secondary | ICD-10-CM

## 2019-08-13 DIAGNOSIS — O99824 Streptococcus B carrier state complicating childbirth: Secondary | ICD-10-CM | POA: Diagnosis present

## 2019-08-13 DIAGNOSIS — O99344 Other mental disorders complicating childbirth: Secondary | ICD-10-CM | POA: Diagnosis present

## 2019-08-13 DIAGNOSIS — F419 Anxiety disorder, unspecified: Secondary | ICD-10-CM | POA: Diagnosis present

## 2019-08-13 DIAGNOSIS — O99892 Other specified diseases and conditions complicating childbirth: Secondary | ICD-10-CM | POA: Diagnosis present

## 2019-08-13 DIAGNOSIS — N96 Recurrent pregnancy loss: Secondary | ICD-10-CM

## 2019-08-13 DIAGNOSIS — F17210 Nicotine dependence, cigarettes, uncomplicated: Secondary | ICD-10-CM | POA: Diagnosis present

## 2019-08-13 DIAGNOSIS — Z3A40 40 weeks gestation of pregnancy: Secondary | ICD-10-CM | POA: Diagnosis not present

## 2019-08-13 DIAGNOSIS — F329 Major depressive disorder, single episode, unspecified: Secondary | ICD-10-CM | POA: Diagnosis present

## 2019-08-13 DIAGNOSIS — O480 Post-term pregnancy: Secondary | ICD-10-CM | POA: Diagnosis not present

## 2019-08-13 DIAGNOSIS — F988 Other specified behavioral and emotional disorders with onset usually occurring in childhood and adolescence: Secondary | ICD-10-CM | POA: Diagnosis present

## 2019-08-13 DIAGNOSIS — Z3480 Encounter for supervision of other normal pregnancy, unspecified trimester: Secondary | ICD-10-CM

## 2019-08-13 DIAGNOSIS — R002 Palpitations: Secondary | ICD-10-CM

## 2019-08-13 LAB — CBC
HCT: 35.3 % — ABNORMAL LOW (ref 36.0–46.0)
Hemoglobin: 12 g/dL (ref 12.0–15.0)
MCH: 30.4 pg (ref 26.0–34.0)
MCHC: 34 g/dL (ref 30.0–36.0)
MCV: 89.4 fL (ref 80.0–100.0)
Platelets: 177 10*3/uL (ref 150–400)
RBC: 3.95 MIL/uL (ref 3.87–5.11)
RDW: 11.5 % (ref 11.5–15.5)
WBC: 7.9 10*3/uL (ref 4.0–10.5)
nRBC: 0 % (ref 0.0–0.2)

## 2019-08-13 LAB — RPR: RPR Ser Ql: NONREACTIVE

## 2019-08-13 LAB — TYPE AND SCREEN
ABO/RH(D): B POS
Antibody Screen: NEGATIVE

## 2019-08-13 LAB — ABO/RH: ABO/RH(D): B POS

## 2019-08-13 MED ORDER — ONDANSETRON HCL 4 MG/2ML IJ SOLN: 4.0000 mg | INTRAMUSCULAR | Status: DC | PRN

## 2019-08-13 MED ORDER — TERBUTALINE SULFATE 1 MG/ML IJ SOLN: 0.2500 mg | Freq: Once | INTRAMUSCULAR | Status: DC | PRN

## 2019-08-13 MED ORDER — LACTULOSE 10 GM/15ML PO SOLN
10.0000 g | Freq: Three times a day (TID) | ORAL | Status: DC | PRN
  Filled 2019-08-13: qty 30

## 2019-08-13 MED ORDER — LIDOCAINE HCL (PF) 1 % IJ SOLN
INTRAMUSCULAR | Status: AC
  Filled 2019-08-13: qty 30

## 2019-08-13 MED ORDER — PHENYLEPHRINE 40 MCG/ML (10ML) SYRINGE FOR IV PUSH (FOR BLOOD PRESSURE SUPPORT)
80.0000 ug | PREFILLED_SYRINGE | INTRAVENOUS | Status: DC | PRN
  Filled 2019-08-13: qty 10

## 2019-08-13 MED ORDER — OXYTOCIN-SODIUM CHLORIDE 30-0.9 UT/500ML-% IV SOLN
INTRAVENOUS | Status: AC
  Filled 2019-08-13: qty 1000

## 2019-08-13 MED ORDER — FENTANYL CITRATE (PF) 100 MCG/2ML IJ SOLN: 50.0000 ug | INTRAMUSCULAR | Status: DC | PRN

## 2019-08-13 MED ORDER — DIBUCAINE (PERIANAL) 1 % EX OINT: 1.0000 "application " | TOPICAL_OINTMENT | CUTANEOUS | Status: DC | PRN

## 2019-08-13 MED ORDER — SODIUM CHLORIDE 0.9 % IV SOLN
5.0000 10*6.[IU] | Freq: Once | INTRAVENOUS | Status: AC
  Administered 2019-08-13: 5 10*6.[IU] via INTRAVENOUS
  Filled 2019-08-13: qty 5

## 2019-08-13 MED ORDER — AMPHETAMINE-DEXTROAMPHET ER 5 MG PO CP24: 5.0000 mg | ORAL_CAPSULE | Freq: Every day | ORAL | Status: DC

## 2019-08-13 MED ORDER — DOXYLAMINE-PYRIDOXINE 10-10 MG PO TBEC: 2.0000 | DELAYED_RELEASE_TABLET | Freq: Every day | ORAL | Status: DC

## 2019-08-13 MED ORDER — LIDOCAINE-EPINEPHRINE (PF) 1.5 %-1:200000 IJ SOLN
INTRAMUSCULAR | Status: DC | PRN
  Administered 2019-08-13: 4 mL via PERINEURAL

## 2019-08-13 MED ORDER — FENTANYL 2.5 MCG/ML W/ROPIVACAINE 0.15% IN NS 100 ML EPIDURAL (ARMC)
EPIDURAL | Status: AC
  Filled 2019-08-13: qty 100

## 2019-08-13 MED ORDER — PRENATAL MULTIVITAMIN CH: 1.0000 | ORAL_TABLET | Freq: Every day | ORAL | Status: DC

## 2019-08-13 MED ORDER — PRENATAL MULTIVITAMIN CH
1.0000 | ORAL_TABLET | Freq: Every day | ORAL | Status: DC
  Administered 2019-08-13: 1 via ORAL
  Filled 2019-08-13 (×2): qty 1

## 2019-08-13 MED ORDER — OXYTOCIN 10 UNIT/ML IJ SOLN
INTRAMUSCULAR | Status: AC
  Filled 2019-08-13: qty 2

## 2019-08-13 MED ORDER — DIPHENHYDRAMINE HCL 50 MG/ML IJ SOLN: 12.5000 mg | INTRAMUSCULAR | Status: DC | PRN

## 2019-08-13 MED ORDER — IBUPROFEN 600 MG PO TABS
600.0000 mg | ORAL_TABLET | Freq: Four times a day (QID) | ORAL | Status: DC
  Administered 2019-08-13 – 2019-08-14 (×4): 600 mg via ORAL
  Filled 2019-08-13 (×4): qty 1

## 2019-08-13 MED ORDER — LIDOCAINE HCL (PF) 1 % IJ SOLN: 30.0000 mL | INTRAMUSCULAR | Status: DC | PRN

## 2019-08-13 MED ORDER — BENZOCAINE-MENTHOL 20-0.5 % EX AERO
1.0000 "application " | INHALATION_SPRAY | CUTANEOUS | Status: DC | PRN
  Administered 2019-08-13: 1 via TOPICAL
  Filled 2019-08-13: qty 56

## 2019-08-13 MED ORDER — ONDANSETRON HCL 4 MG/2ML IJ SOLN
4.0000 mg | Freq: Four times a day (QID) | INTRAMUSCULAR | Status: DC | PRN
  Administered 2019-08-13: 4 mg via INTRAVENOUS
  Filled 2019-08-13: qty 2

## 2019-08-13 MED ORDER — EPHEDRINE 5 MG/ML INJ
10.0000 mg | INTRAVENOUS | Status: DC | PRN
  Filled 2019-08-13: qty 2

## 2019-08-13 MED ORDER — SENNOSIDES-DOCUSATE SODIUM 8.6-50 MG PO TABS
2.0000 | ORAL_TABLET | ORAL | Status: DC
  Administered 2019-08-14: 2 via ORAL
  Filled 2019-08-13: qty 2

## 2019-08-13 MED ORDER — LACTATED RINGERS IV SOLN: 500.0000 mL | INTRAVENOUS | Status: DC | PRN

## 2019-08-13 MED ORDER — WITCH HAZEL-GLYCERIN EX PADS
1.0000 "application " | MEDICATED_PAD | CUTANEOUS | Status: DC | PRN
  Administered 2019-08-13: 1 via TOPICAL
  Filled 2019-08-13: qty 100

## 2019-08-13 MED ORDER — CITRANATAL ASSURE 35-1 & 300 MG PO MISC: 2.0000 | Freq: Every day | ORAL | Status: DC

## 2019-08-13 MED ORDER — OXYTOCIN BOLUS FROM INFUSION
333.0000 mL | Freq: Once | INTRAVENOUS | Status: AC
  Administered 2019-08-13: 333 mL via INTRAVENOUS

## 2019-08-13 MED ORDER — HYDROCODONE-ACETAMINOPHEN 5-325 MG PO TABS: 1.0000 | ORAL_TABLET | Freq: Three times a day (TID) | ORAL | Status: DC | PRN

## 2019-08-13 MED ORDER — OXYTOCIN-SODIUM CHLORIDE 30-0.9 UT/500ML-% IV SOLN: 2.5000 [IU]/h | INTRAVENOUS | Status: DC

## 2019-08-13 MED ORDER — BUPIVACAINE HCL (PF) 0.25 % IJ SOLN
INTRAMUSCULAR | Status: DC | PRN
  Administered 2019-08-13: 5 mL via EPIDURAL
  Administered 2019-08-13: 3 mL via EPIDURAL

## 2019-08-13 MED ORDER — FENTANYL 2.5 MCG/ML W/ROPIVACAINE 0.15% IN NS 100 ML EPIDURAL (ARMC)
12.0000 mL/h | EPIDURAL | Status: DC
  Administered 2019-08-13: 12 mL/h via EPIDURAL

## 2019-08-13 MED ORDER — AMMONIA AROMATIC IN INHA
RESPIRATORY_TRACT | Status: AC
  Filled 2019-08-13: qty 10

## 2019-08-13 MED ORDER — DIPHENHYDRAMINE HCL 25 MG PO CAPS: 25.0000 mg | ORAL_CAPSULE | Freq: Four times a day (QID) | ORAL | Status: DC | PRN

## 2019-08-13 MED ORDER — VARICELLA VIRUS VACCINE LIVE 1350 PFU/0.5ML IJ SUSR
0.5000 mL | INTRAMUSCULAR | Status: DC | PRN
  Filled 2019-08-13: qty 0.5

## 2019-08-13 MED ORDER — ACETAMINOPHEN 325 MG PO TABS
650.0000 mg | ORAL_TABLET | ORAL | Status: DC | PRN
  Administered 2019-08-13 – 2019-08-14 (×3): 650 mg via ORAL
  Filled 2019-08-13 (×3): qty 2

## 2019-08-13 MED ORDER — LACTATED RINGERS IV SOLN
500.0000 mL | Freq: Once | INTRAVENOUS | Status: AC
  Administered 2019-08-13: 500 mL via INTRAVENOUS

## 2019-08-13 MED ORDER — OXYTOCIN-SODIUM CHLORIDE 30-0.9 UT/500ML-% IV SOLN
1.0000 m[IU]/min | INTRAVENOUS | Status: DC
  Administered 2019-08-13: 2 m[IU]/min via INTRAVENOUS

## 2019-08-13 MED ORDER — COCONUT OIL OIL
1.0000 "application " | TOPICAL_OIL | Status: DC | PRN
  Administered 2019-08-13: 1 via TOPICAL
  Filled 2019-08-13: qty 120

## 2019-08-13 MED ORDER — LACTATED RINGERS IV SOLN: INTRAVENOUS | Status: DC

## 2019-08-13 MED ORDER — AMMONIA AROMATIC IN INHA: 0.3000 mL | Freq: Once | RESPIRATORY_TRACT | Status: DC | PRN

## 2019-08-13 MED ORDER — LIDOCAINE HCL (PF) 1 % IJ SOLN
INTRAMUSCULAR | Status: DC | PRN
  Administered 2019-08-13: 3 mL

## 2019-08-13 MED ORDER — ONDANSETRON HCL 4 MG PO TABS: 4.0000 mg | ORAL_TABLET | ORAL | Status: DC | PRN

## 2019-08-13 MED ORDER — SIMETHICONE 80 MG PO CHEW: 80.0000 mg | CHEWABLE_TABLET | ORAL | Status: DC | PRN

## 2019-08-13 MED ORDER — ACETAMINOPHEN 325 MG PO TABS
650.0000 mg | ORAL_TABLET | ORAL | Status: DC | PRN
  Administered 2019-08-13: 650 mg via ORAL
  Filled 2019-08-13: qty 2

## 2019-08-13 MED ORDER — MISOPROSTOL 200 MCG PO TABS
ORAL_TABLET | ORAL | Status: AC
  Filled 2019-08-13: qty 4

## 2019-08-13 MED ORDER — BUPROPION HCL ER (XL) 150 MG PO TB24
150.0000 mg | ORAL_TABLET | Freq: Every day | ORAL | Status: DC
  Filled 2019-08-13 (×2): qty 1

## 2019-08-13 MED ORDER — MISOPROSTOL 200 MCG PO TABS: 800.0000 ug | ORAL_TABLET | Freq: Once | ORAL | Status: DC | PRN

## 2019-08-13 MED ORDER — FERROUS SULFATE 325 (65 FE) MG PO TABS
325.0000 mg | ORAL_TABLET | Freq: Two times a day (BID) | ORAL | Status: DC
  Administered 2019-08-13 – 2019-08-14 (×2): 325 mg via ORAL
  Filled 2019-08-13 (×2): qty 1

## 2019-08-13 MED ORDER — PENICILLIN G POT IN DEXTROSE 60000 UNIT/ML IV SOLN
3.0000 10*6.[IU] | INTRAVENOUS | Status: DC
  Administered 2019-08-13: 3 10*6.[IU] via INTRAVENOUS
  Filled 2019-08-13 (×7): qty 50

## 2019-08-13 NOTE — H&P (Addendum)
OB History & Physical   History of Present Illness:  Chief Complaint:  Complains of leakage of fluid from the vagina starting at 0300 this AM. Fluid was clear in color. HPI:  Sherry Reynolds is a 29 y.o. [phone] female with EDC=08/10/2019 at [redacted]w[redacted]d dated by LMP=8week ultrasound.  Her pregnancy has been complicated by ADD, anxiety/depression, recurrent miscarriages, tobacco use, and a positive GBS culture..  She presents to L&D for evaluation of possible SROM. Was contracting irregularly through the night when she started leaking clear-yellow amniotic fluid at 0300. She continues to leak fluid. Contractions have gotten stronger since she started leaking fluid. No vaginal bleeding. Was scheduled for induction of labor today at noon   Prenatal care site: Prenatal care at [facility] has also been remarkable for a 41# weight gain and for the following: [facility] Prenatal Labs  Dating LMP=8wk [place] Blood type: --/--/PENDING (08/05 0339)   Genetic Screen     NIPS: diploid XY Antibody:PENDING (08/05 3536)  Anatomic [place] Normal, posterior placenta Rubella: 1.33 (12/23 1150) Varicella: Nonimmune  GTT Early:               Third trimester: 107 RPR: Non Reactive (04/26 1148)   Rhogam  HBsAg: Negative (12/23 1150)   TDaP vaccine          06/16/19             Flu Shot: HIV: Non Reactive (04/26 1148)   Baby Food        Breast                        RWE:[id number]/-- (07/06 1232)  Contraception  Pap: 03/22/2017 NIL/neg HRHPV  CBB     CS/VBAC    Support Person          Maternal Medical History:   Past Medical History:  Diagnosis Date   ADD (attention deficit disorder)    Anxiety and depression    Miscarriage    twice    Past Surgical History:  Procedure Laterality Date   BREAST ENHANCEMENT SURGERY     WISDOM TOOTH EXTRACTION      No Known Allergies  Prior to Admission medications   Medication Sig Start Date End Date Taking? Authorizing Provider  Prenat w/o A-[id number]-[id number] (CITRANATAL  ASSURE) 35-1 & 300 MG tablet Take 2 tablets by mouth daily. 02/19/19  Yes Conard Novak, MD  amphetamine-dextroamphetamine (ADDERALL XR) 5 MG 24 hr capsule Take 1 capsule (5 mg total) by mouth daily. Patient not taking: Reported on 08/13/2019 02/12/19   Conard Novak, MD  buPROPion (WELLBUTRIN XL) 150 MG 24 hr tablet TAKE 1 TABLET BY MOUTH EVERY DAY Patient not taking: Reported on 08/13/2019 03/18/19   Conard Novak, MD  Doxylamine-Pyridoxine (DICLEGIS) 10-10 MG TBEC Take 2 tablets by mouth at bedtime. If symptoms persist, add one tablet in the morning and one in the afternoon Patient not taking: Reported on 08/13/2019 02/04/19   Conard Novak, MD  lactulose (CEPHULAC) 10 g packet Take 1 packet (10 g total) by mouth 3 (three) times daily as needed (Constipation). Patient not taking: Reported on 01/15/2019 01/07/19   Conard Novak, MD          Social History: She  reports that she has been smoking cigarettes. She has been smoking about 0.25 packs per day. She has never used smokeless tobacco. She reports previous alcohol use of about 2.0 - 3.0 standard drinks of alcohol per week.  She reports that she does not use drugs.  Family History: family history includes Endometriosis in her mother.   Review of Systems: Negative x 10 systems reviewed except as noted in the HPI.      Physical Exam:  Vital Signs: BP 119/72 (BP Location: Left Arm)    Pulse 95    Temp 99.1 F (37.3 C) (Oral)    Resp 18    Ht 5\' 2"  (1.575 m)    Wt 76.2 kg    LMP 11/03/2018    BMI 30.73 kg/m  General: gravid female, pauses to breath thru most contractions  HEENT: normocephalic, atraumatic Heart: regular rate & rhythm.  No murmurs/rubs/gallops Lungs: clear to auscultation bilaterally Abdomen: soft, gravid, non-tender;  EFW: 7# Pelvic:   External: Normal external female genitalia  Cervix: Dilation: 3 / Effacement (%): 50 / Station: -2     ROM: +pooling; gross rupture Extremities: non-tender,  symmetric, no edema bilaterally.  DTRs: +3/+3  Neurologic: Alert & oriented x 3.   Baseline FHR: baseline 130 with accelerations to 160s to 170s, moderate variability Toco: contractions every 3-4 minutes  Bedside Ultrasound:  Presentation cephalic/ ROT  Assessment:  Sherry Reynolds is a 29 y.o. [phone] female at [redacted]w[redacted]d in early labor with SROM FWB: Cat 1 tracing Plan:  1. Admit to Labor & Delivery-anticipate vaginal delivery, expectant management for now 1. Will notify Dr [redacted]w[redacted]d of admission 2. CBC, T&S, Clrs, IVF 3. GBS positive-begin Penicillin PPX.   4. Consents obtained. 5. Desires epidural 6. Breast 7. B POS/RI/VNI-offer Varivax at discharge 8. Contraception: ?  Jean Rosenthal  08/13/2019 4:52 AM

## 2019-08-13 NOTE — Anesthesia Procedure Notes (Signed)
Epidural Patient location during procedure: OB  Staffing Performed: anesthesiologist   Preanesthetic Checklist Completed: patient identified, IV checked, site marked, risks and benefits discussed, surgical consent, monitors and equipment checked, pre-op evaluation and timeout performed  Epidural Patient position: sitting Prep: Betadine Patient monitoring: heart rate, continuous pulse ox and blood pressure Approach: midline Location: L4-L5 Injection technique: LOR saline  Needle:  Needle type: Tuohy  Needle gauge: 17 G Needle length: 9 cm and 9 Needle insertion depth: 5 cm Catheter type: closed end flexible Catheter size: 19 Gauge Catheter at skin depth: 11 cm Test dose: negative and 1.5% lidocaine with Epi 1:200 K  Assessment Sensory level: T10 Events: blood not aspirated, injection not painful, no injection resistance, no paresthesia and negative IV test  Additional Notes   Patient tolerated the insertion well without complications.-SATD -IVTD. No paresthesia. Refer to [facility] nursing for VS and dosingReason for block:procedure for pain     

## 2019-08-13 NOTE — Anesthesia Preprocedure Evaluation (Signed)
Anesthesia Evaluation  Patient identified by MRN, date of birth, ID band Patient awake    Reviewed: Allergy & Precautions, H&P , NPO status , Patient's Chart, lab work & pertinent test results, reviewed documented beta blocker date and time   Airway Mallampati: II  TM Distance: >3 FB Neck ROM: full    Dental no notable dental hx. (+) Teeth Intact   Pulmonary neg pulmonary ROS, Current Smoker,    Pulmonary exam normal breath sounds clear to auscultation       Cardiovascular Exercise Tolerance: Good negative cardio ROS   Rhythm:regular Rate:Normal     Neuro/Psych PSYCHIATRIC DISORDERS Anxiety Depression negative neurological ROS     GI/Hepatic negative GI ROS, Neg liver ROS,   Endo/Other  negative endocrine ROSdiabetes, Gestational  Renal/GU      Musculoskeletal   Abdominal   Peds  Hematology negative hematology ROS (+)   Anesthesia Other Findings   Reproductive/Obstetrics (+) Pregnancy                             Anesthesia Physical Anesthesia Plan  ASA: II  Anesthesia Plan: Epidural   Post-op Pain Management:    Induction:   PONV Risk Score and Plan:   Airway Management Planned:   Additional Equipment:   Intra-op Plan:   Post-operative Plan:   Informed Consent: I have reviewed the patients History and Physical, chart, labs and discussed the procedure including the risks, benefits and alternatives for the proposed anesthesia with the patient or authorized representative who has indicated his/her understanding and acceptance.       Plan Discussed with:   Anesthesia Plan Comments:         Anesthesia Quick Evaluation  

## 2019-08-13 NOTE — Lactation Note (Signed)
This note was copied from a baby's chart. Lactation Consultation Note  Patient Name: Sherry Reynolds [id number] Date: 08/13/2019    Assisted mom with positioning with pillow support in comfortable position in cradle hold on left breast.  Demonstrated hand expression of lots of colostrum.  Mom reports that she has been leaking for a long time.  [name] latched with minimal assistance and began strong, rhythmic sucking with audible swallows.  Mom denies any breast or nipple pain.  Mom breast fed her first baby, who is 31 years old now, for 1 month.  She reports having such an abundance of milk that her breasts stayed full.  At 1 month she got mastitis and then the baby did not want the breast so her milk eventually dried up.  Discussed with mom how to prevent getting mastitis with [name].  Encouraged mom to put [name] to the breast whenever he demonstrated feeding cues and to let him nurse as long as he wanted on both breasts.  Hand out given on what to expect with feedings the first 4 days of [name]'s life reviewing normal newborn stomach size, adequate intake and out put, supply and demand, skin to skin, normal course of lactation and routine newborn feeding patterns.  Lactation [employer] given and reviewed.  Lactation name and number written on white board and encouraged to call with any questions, concerns or assistance.  Maternal Data    Feeding Feeding Type:  (infant sleepy would not latch)  LATCH Score                   Interventions    Lactation Tools Discussed/Used     Consult Status      Louis Meckel 08/13/2019, 8:37 PM

## 2019-08-13 NOTE — Progress Notes (Signed)
L&D Progress Note   S: Feeling better, but occasionally feels her heart race.  O: BP (!) 101/46   Pulse 88   Temp 98.6 F (37 C) (Oral)   Resp 18   Ht 5\' 2"  (1.575 m)   Wt 76.2 kg   LMP 11/03/2018   SpO2 100%   BMI 30.73 kg/m    After epidural, her heart rate would cyclically climb into the 150s then come down to the 90s and she became nauseous and felt palpitations. No chest pain. Blood pressure fell to 90s/50s. Epidural level was at T5. She was turned to her side, given a 500 ml LR bolus and Zofran IV,  and epidural was stopped for 30 minutes. She started feeling better and pulse returned to the 80s. Her epidural was resumed at a rate of 37ml/hr.  Cervix remains 3 cm/60%/-2.  Contractions every 1-6 minutes apart. FHR: 130 baseline with accelerations, moderate variability  A: NO progress in cervical dilation, now 4 hours post SROM. Cyclical tachycardia-may have been in response to mild hypotension following the epidural  P: Pitocin augmentation Notified Dr 4m of status  Jean Rosenthal, CNM

## 2019-08-13 NOTE — Discharge Summary (Signed)
Postpartum Discharge Summary  Patient Name: Sherry Reynolds DOB: 1990/04/11 MRN: [id number]  Date of admission: 08/13/2019 Delivery date:08/13/2019  Delivering provider: Prentice Docker D  Date of discharge: 08/14/2019  Admitting diagnosis: Leakage of amniotic fluid [O42.90] Intrauterine pregnancy: [redacted]w[redacted]d    Secondary diagnosis:  Active Problems:   Supervision of other normal pregnancy, antepartum   Leakage of amniotic fluid  Additional problems: none    Discharge diagnosis: Term Pregnancy Delivered                                              Post partum procedures:none Augmentation: Pitocin Complications: None  Hospital course: Onset of Labor With Vaginal Delivery      29y.o. yo [id number] admitted in Latent Labor on 08/13/2019. Patient had an uncomplicated labor course as follows:  Membrane Rupture Time/Date: 3:00 AM ,08/13/2019   Delivery Method:Vaginal, Spontaneous  Episiotomy: None  Lacerations:  1st degree  Patient had an uncomplicated postpartum course.  She is ambulating, tolerating a regular diet, passing flatus, and urinating well. Patient is discharged home in stable condition on 08/14/19.  Newborn Data: Birth date:08/13/2019  Birth time:11:26 AM  Gender:Female  Living status:Living  Apgars:7 ,9  Weight:3520 g   Magnesium Sulfate received: No BMZ received: No Rhophylac:No MMR:No T-DaP:Given prenatally on 06/16/2019 Transfusion:No  Physical exam  Vitals:   08/13/19 1626 08/13/19 2010 08/14/19 0058 08/14/19 0520  BP: 113/71 110/63 111/63 109/62  Pulse: 88 79 73 78  Resp: '18 20 18 20  ' Temp:  98 F (36.7 C) 98 F (36.7 C) 98.7 F (37.1 C)  TempSrc:      SpO2: 98% 99% 98%   Weight:      Height:       General: alert, cooperative and no distress Lochia: appropriate Uterine Fundus: firm Incision: N/A DVT Evaluation: No evidence of DVT seen on physical exam. No cords or calf tenderness. No significant calf/ankle edema. Labs: Lab Results    Component Value Date   WBC 6.8 08/14/2019   HGB 10.7 (L) 08/14/2019   HCT 31.2 (L) 08/14/2019   MCV 89.7 08/14/2019   PLT 128 (L) 08/14/2019   CMP Latest Ref Rng & Units 12/29/2018  Glucose 65 - 99 mg/dL 85  BUN 6 - 20 mg/dL 9  Creatinine 0.57 - 1.00 mg/dL 0.49(L)  Sodium 134 - 144 mmol/L 134  Potassium 3.5 - 5.2 mmol/L 4.2  Chloride 96 - 106 mmol/L 100  CO2 20 - 29 mmol/L 20  Calcium 8.7 - 10.2 mg/dL 9.0  Total Protein 6.0 - 8.5 g/dL 6.4  Total Bilirubin 0.0 - 1.2 mg/dL 0.4  Alkaline Phos 39 - 117 IU/L 43  AST 0 - 40 IU/L 17  ALT 0 - 32 IU/L 13   Edinburgh Score: No flowsheet data found.    After visit meds:  Allergies as of 08/14/2019   No Known Allergies     Medication List    STOP taking these medications   amphetamine-dextroamphetamine 5 MG 24 hr capsule Commonly known as: ADDERALL XR   buPROPion 150 MG 24 hr tablet Commonly known as: WELLBUTRIN XL   Doxylamine-Pyridoxine 10-10 MG Tbec Commonly known as: Diclegis   lactulose 10 g packet Commonly known as: CESiloam Prenatal Gummies/DHA & FA 0.4-32.5 MG Chew     TAKE these medications   CitraNatal Assure  35-1 & 300 MG tablet Take 2 tablets by mouth daily.   ferrous sulfate 325 (65 FE) MG tablet Take 1 tablet (325 mg total) by mouth 2 (two) times daily with a meal.   ibuprofen 600 MG tablet Commonly known as: ADVIL Take 1 tablet (600 mg total) by mouth every 6 (six) hours.            Discharge Care Instructions  (From admission, onward)         Start     Ordered   08/14/19 0000  Discharge wound care:       Comments: Perform wound care instructions   08/14/19 0820           Discharge home in stable condition Infant Feeding: Breast Infant Disposition:home with mother Discharge instruction: per After Visit Summary and Postpartum booklet. Activity: Advance as tolerated. Pelvic rest for 6 weeks.  Diet: routine diet Anticipated Birth Control: POPs Postpartum Appointment:2  weeks Additional Postpartum F/U: Postpartum Depression checkup Future Appointments:No future appointments. Follow up Visit:  Follow-up Information    Will Bonnet, MD. Schedule an appointment as soon as possible for a visit in 6 week(s).   Specialty: Obstetrics and Gynecology Why: Six week postpartum visit Contact information: [address] [place] [address] [phone]               SIGNED: Prentice Docker, MD, [facility], [facility] 08/14/2019 8:22 AM

## 2019-08-14 LAB — CBC
HCT: 31.2 % — ABNORMAL LOW (ref 36.0–46.0)
Hemoglobin: 10.7 g/dL — ABNORMAL LOW (ref 12.0–15.0)
MCH: 30.7 pg (ref 26.0–34.0)
MCHC: 34.3 g/dL (ref 30.0–36.0)
MCV: 89.7 fL (ref 80.0–100.0)
Platelets: 128 10*3/uL — ABNORMAL LOW (ref 150–400)
RBC: 3.48 MIL/uL — ABNORMAL LOW (ref 3.87–5.11)
RDW: 11.8 % (ref 11.5–15.5)
WBC: 6.8 10*3/uL (ref 4.0–10.5)
nRBC: 0 % (ref 0.0–0.2)

## 2019-08-14 MED ORDER — FERROUS SULFATE 325 (65 FE) MG PO TABS: 325.0000 mg | ORAL_TABLET | Freq: Two times a day (BID) | ORAL | 3 refills | Status: DC

## 2019-08-14 MED ORDER — IBUPROFEN 600 MG PO TABS: 600.0000 mg | ORAL_TABLET | Freq: Four times a day (QID) | ORAL | 0 refills | Status: DC

## 2019-08-14 NOTE — Progress Notes (Signed)
Discharge order received from doctor. Reviewed discharge instructions and prescriptions with patient and answered all questions. Follow up appointment instructions given. Patient verbalized understanding. ID bands checked. Patient discharged home with infant via wheelchair by nursing/auxillary.    Dontai Pember, RN  

## 2019-08-14 NOTE — Discharge Instructions (Signed)

## 2019-08-14 NOTE — Anesthesia Postprocedure Evaluation (Signed)
Anesthesia Post Note  Patient: Sherry Reynolds  Procedure(s) Performed: AN AD HOC LABOR EPIDURAL  Patient location during evaluation: Mother Baby Anesthesia Type: Epidural Level of consciousness: oriented and awake and alert Pain management: pain level controlled Vital Signs Assessment: post-procedure vital signs reviewed and stable Respiratory status: spontaneous breathing and respiratory function stable Cardiovascular status: blood pressure returned to baseline and stable Postop Assessment: no headache, no backache, no apparent nausea or vomiting and able to ambulate Anesthetic complications: no   No complications documented.   Last Vitals:  Vitals:   08/14/19 0058 08/14/19 0520  BP: 111/63 109/62  Pulse: 73 78  Resp: 18 20  Temp: 36.7 C 37.1 C  SpO2: 98%     Last Pain:  Vitals:   08/13/19 2200  TempSrc:   PainSc: 2                  Starling Manns

## 2019-08-14 NOTE — Lactation Note (Addendum)
This note was copied from a baby's chart. Lactation Consultation Note  Patient Name: Boy Kaisy Severino OINOM'V Date: 08/14/2019 Reason for consult: Follow-up assessment   Maternal Data Has patient been taught Hand Expression?: Yes Does the patient have breastfeeding experience prior to this delivery?: Yes Has had breast augmentation, ? Yr, report lots of milk with last baby, mastitis, encouraged frequent feeds to soften breasts and remove milk or pumping to soften and remove milk, if breasts still full after nursing, pump until they soften, may pump until soft if fill in between feedings Feeding Feeding Type: Breast Fed Swallows heard, mom shown how to sandwich breast tissue to get a deeper latch LATCH Score Latch: Grasps breast easily, tongue down, lips flanged, rhythmical sucking.  Audible Swallowing: Spontaneous and intermittent  Type of Nipple: Everted at rest and after stimulation, nipple piercings noted, no rings present but leaking milk from piercings  Comfort (Breast/Nipple): Filling, red/small blisters or bruises, mild/mod discomfort (filling), breasts sl firm from augmentation/filling of milk production  Hold (Positioning): Assistance needed to correctly position infant at breast and maintain latch.  LATCH Score: 8  Interventions Interventions: Assisted with latch;Skin to skin;Hand express;Adjust position  Lactation Tools Discussed/Used WIC Program: Yes   Consult Status Consult Status: PRN LC name written on white board   Dyann Kief 08/14/2019, 3:47 PM

## 2019-08-22 ENCOUNTER — Other Ambulatory Visit: Payer: Self-pay | Admitting: Obstetrics and Gynecology

## 2019-08-22 DIAGNOSIS — O9122 Nonpurulent mastitis associated with the puerperium: Secondary | ICD-10-CM

## 2019-08-22 MED ORDER — CEPHALEXIN 500 MG PO TABS: 500.0000 mg | ORAL_TABLET | Freq: Four times a day (QID) | ORAL | 0 refills | Status: DC

## 2019-08-22 MED ORDER — CEPHALEXIN 500 MG PO TABS: 500.0000 mg | ORAL_TABLET | Freq: Four times a day (QID) | ORAL | 0 refills | Status: AC

## 2019-08-24 ENCOUNTER — Telehealth: Payer: Self-pay

## 2019-08-24 NOTE — Telephone Encounter (Signed)
Pt called after hour nurse 08/22/19 5:54pm c/o sharp pain in breasts, fever of 102 08/21/19 99.4 8/14; thinks she may have mastitis - feeling knots in breasts and aches; is pumping as much as she can; 1wk pp.  Adv nurse adv to be seen; pt declined.  SDJ rx'd antibx.  Left msg for pt to call me back - did she get medication and how is she feeling now?

## 2019-09-11 NOTE — Telephone Encounter (Signed)
Pt hasn't returned call; msg closed.

## 2019-12-07 DIAGNOSIS — S82121A Displaced fracture of lateral condyle of right tibia, initial encounter for closed fracture: Secondary | ICD-10-CM | POA: Diagnosis not present

## 2019-12-07 DIAGNOSIS — M25561 Pain in right knee: Secondary | ICD-10-CM | POA: Diagnosis not present

## 2019-12-07 DIAGNOSIS — S82141A Displaced bicondylar fracture of right tibia, initial encounter for closed fracture: Secondary | ICD-10-CM

## 2019-12-07 HISTORY — DX: Displaced bicondylar fracture of right tibia, initial encounter for closed fracture: S82.141A

## 2019-12-09 ENCOUNTER — Other Ambulatory Visit: Payer: Self-pay | Admitting: Orthopedic Surgery

## 2019-12-09 ENCOUNTER — Ambulatory Visit
Admission: RE | Admit: 2019-12-09 | Discharge: 2019-12-09 | Disposition: A | Discharge status: Home / Self Care | Payer: Medicaid Other | Source: Ambulatory Visit | Attending: Orthopedic Surgery | Admitting: Orthopedic Surgery

## 2019-12-09 DIAGNOSIS — S82141A Displaced bicondylar fracture of right tibia, initial encounter for closed fracture: Secondary | ICD-10-CM | POA: Diagnosis not present

## 2019-12-09 DIAGNOSIS — S72434A Nondisplaced fracture of medial condyle of right femur, initial encounter for closed fracture: Secondary | ICD-10-CM | POA: Diagnosis not present

## 2019-12-09 DIAGNOSIS — M25561 Pain in right knee: Secondary | ICD-10-CM

## 2019-12-09 DIAGNOSIS — S82454A Nondisplaced comminuted fracture of shaft of right fibula, initial encounter for closed fracture: Secondary | ICD-10-CM | POA: Diagnosis not present

## 2019-12-09 DIAGNOSIS — S82831A Other fracture of upper and lower end of right fibula, initial encounter for closed fracture: Secondary | ICD-10-CM | POA: Diagnosis not present

## 2019-12-10 DIAGNOSIS — S82111A Displaced fracture of right tibial spine, initial encounter for closed fracture: Secondary | ICD-10-CM | POA: Diagnosis not present

## 2019-12-10 DIAGNOSIS — S82831A Other fracture of upper and lower end of right fibula, initial encounter for closed fracture: Secondary | ICD-10-CM | POA: Diagnosis not present

## 2019-12-10 DIAGNOSIS — M25561 Pain in right knee: Secondary | ICD-10-CM | POA: Diagnosis not present

## 2019-12-10 DIAGNOSIS — S72414A Nondisplaced unspecified condyle fracture of lower end of right femur, initial encounter for closed fracture: Secondary | ICD-10-CM | POA: Diagnosis not present

## 2019-12-10 DIAGNOSIS — S82101S Unspecified fracture of upper end of right tibia, sequela: Secondary | ICD-10-CM | POA: Diagnosis not present

## 2019-12-11 ENCOUNTER — Other Ambulatory Visit: Payer: Self-pay

## 2019-12-11 ENCOUNTER — Encounter (HOSPITAL_BASED_OUTPATIENT_CLINIC_OR_DEPARTMENT_OTHER): Payer: Self-pay | Admitting: Orthopedic Surgery

## 2019-12-11 NOTE — Progress Notes (Signed)
Spoke w/ via phone for pre-op interview--- PT Lab needs dos----  Urine preg             Lab results------ no COVID test ------ 12-14-2019 @ 1110 Arrive at ------- 1200 NPO after MN NO Solid Food.  Clear liquids from MN until--- 1100 Medications to take morning of surgery ----- NONE Diabetic medication ----- n/a Patient Special Instructions ----- n/a Pre-Op special Istructions ----- n/a Patient verbalized understanding of instructions that were given at this phone interview. Patient denies shortness of breath, chest pain, fever, cough at this phone interview.

## 2019-12-14 ENCOUNTER — Other Ambulatory Visit (HOSPITAL_COMMUNITY)
Admission: RE | Admit: 2019-12-14 | Discharge: 2019-12-14 | Disposition: A | Discharge status: Home / Self Care | Payer: Medicaid Other | Source: Ambulatory Visit | Attending: Orthopedic Surgery | Admitting: Orthopedic Surgery

## 2019-12-14 DIAGNOSIS — Z20822 Contact with and (suspected) exposure to covid-19: Secondary | ICD-10-CM | POA: Diagnosis not present

## 2019-12-14 DIAGNOSIS — Z01812 Encounter for preprocedural laboratory examination: Secondary | ICD-10-CM | POA: Diagnosis not present

## 2019-12-14 LAB — SARS CORONAVIRUS 2 (TAT 6-24 HRS): SARS Coronavirus 2: NEGATIVE

## 2019-12-15 ENCOUNTER — Ambulatory Visit (HOSPITAL_BASED_OUTPATIENT_CLINIC_OR_DEPARTMENT_OTHER)
Admission: RE | Admit: 2019-12-15 | Discharge: 2019-12-15 | Disposition: A | Discharge status: Home / Self Care | Payer: Medicaid Other | Attending: Orthopedic Surgery | Admitting: Orthopedic Surgery

## 2019-12-15 ENCOUNTER — Encounter (HOSPITAL_BASED_OUTPATIENT_CLINIC_OR_DEPARTMENT_OTHER): Payer: Self-pay | Admitting: Orthopedic Surgery

## 2019-12-15 ENCOUNTER — Ambulatory Visit (HOSPITAL_BASED_OUTPATIENT_CLINIC_OR_DEPARTMENT_OTHER): Payer: Medicaid Other | Admitting: Anesthesiology

## 2019-12-15 ENCOUNTER — Encounter (HOSPITAL_BASED_OUTPATIENT_CLINIC_OR_DEPARTMENT_OTHER)
Admission: RE | Disposition: A | Discharge status: Home / Self Care | Payer: Self-pay | Source: Home / Self Care | Attending: Orthopedic Surgery

## 2019-12-15 ENCOUNTER — Other Ambulatory Visit: Payer: Self-pay

## 2019-12-15 ENCOUNTER — Ambulatory Visit (HOSPITAL_COMMUNITY): Payer: Medicaid Other

## 2019-12-15 DIAGNOSIS — S82831A Other fracture of upper and lower end of right fibula, initial encounter for closed fracture: Secondary | ICD-10-CM | POA: Diagnosis not present

## 2019-12-15 DIAGNOSIS — Z87891 Personal history of nicotine dependence: Secondary | ICD-10-CM | POA: Diagnosis not present

## 2019-12-15 DIAGNOSIS — S82142A Displaced bicondylar fracture of left tibia, initial encounter for closed fracture: Secondary | ICD-10-CM | POA: Diagnosis not present

## 2019-12-15 DIAGNOSIS — S82141A Displaced bicondylar fracture of right tibia, initial encounter for closed fracture: Secondary | ICD-10-CM | POA: Diagnosis not present

## 2019-12-15 DIAGNOSIS — Z419 Encounter for procedure for purposes other than remedying health state, unspecified: Secondary | ICD-10-CM

## 2019-12-15 DIAGNOSIS — F418 Other specified anxiety disorders: Secondary | ICD-10-CM | POA: Diagnosis not present

## 2019-12-15 DIAGNOSIS — S82121A Displaced fracture of lateral condyle of right tibia, initial encounter for closed fracture: Secondary | ICD-10-CM | POA: Diagnosis not present

## 2019-12-15 HISTORY — PX: ORIF TIBIA PLATEAU: SHX2132

## 2019-12-15 HISTORY — DX: Personal history of other complications of pregnancy, childbirth and the puerperium: Z87.59

## 2019-12-15 LAB — POCT PREGNANCY, URINE: Preg Test, Ur: NEGATIVE

## 2019-12-15 SURGERY — OPEN REDUCTION INTERNAL FIXATION (ORIF) TIBIAL PLATEAU
Anesthesia: General | Site: Leg Lower | Laterality: Right

## 2019-12-15 MED ORDER — CELECOXIB 200 MG PO CAPS
200.0000 mg | ORAL_CAPSULE | Freq: Once | ORAL | Status: AC
  Administered 2019-12-15: 200 mg via ORAL

## 2019-12-15 MED ORDER — PROPOFOL 10 MG/ML IV BOLUS
INTRAVENOUS | Status: DC | PRN
  Administered 2019-12-15: 200 mg via INTRAVENOUS

## 2019-12-15 MED ORDER — SCOPOLAMINE 1 MG/3DAYS TD PT72
1.0000 | MEDICATED_PATCH | TRANSDERMAL | Status: DC
  Administered 2019-12-15: 1.5 mg via TRANSDERMAL

## 2019-12-15 MED ORDER — GABAPENTIN 300 MG PO CAPS
ORAL_CAPSULE | ORAL | Status: AC
  Filled 2019-12-15: qty 1

## 2019-12-15 MED ORDER — CEFAZOLIN SODIUM-DEXTROSE 2-4 GM/100ML-% IV SOLN
2.0000 g | INTRAVENOUS | Status: AC
  Administered 2019-12-15: 2 g via INTRAVENOUS

## 2019-12-15 MED ORDER — FENTANYL CITRATE (PF) 250 MCG/5ML IJ SOLN
INTRAMUSCULAR | Status: AC
  Filled 2019-12-15: qty 5

## 2019-12-15 MED ORDER — PROPOFOL 10 MG/ML IV BOLUS
INTRAVENOUS | Status: AC
  Filled 2019-12-15: qty 20

## 2019-12-15 MED ORDER — ENOXAPARIN SODIUM 40 MG/0.4ML ~~LOC~~ SOLN: 40.0000 mg | SUBCUTANEOUS | 0 refills | Status: DC

## 2019-12-15 MED ORDER — HYDROMORPHONE HCL 1 MG/ML IJ SOLN
INTRAMUSCULAR | Status: DC | PRN
  Administered 2019-12-15: 1 mg via INTRAVENOUS
  Administered 2019-12-15 (×2): .5 mg via INTRAVENOUS

## 2019-12-15 MED ORDER — ONDANSETRON 4 MG PO TBDP: 4.0000 mg | ORAL_TABLET | Freq: Three times a day (TID) | ORAL | 0 refills | Status: DC | PRN

## 2019-12-15 MED ORDER — GABAPENTIN 300 MG PO CAPS
300.0000 mg | ORAL_CAPSULE | Freq: Once | ORAL | Status: AC
  Administered 2019-12-15: 300 mg via ORAL

## 2019-12-15 MED ORDER — FENTANYL CITRATE (PF) 100 MCG/2ML IJ SOLN
INTRAMUSCULAR | Status: DC | PRN
  Administered 2019-12-15 (×3): 50 ug via INTRAVENOUS
  Administered 2019-12-15: 100 ug via INTRAVENOUS

## 2019-12-15 MED ORDER — ONDANSETRON HCL 4 MG/2ML IJ SOLN
INTRAMUSCULAR | Status: AC
  Filled 2019-12-15: qty 2

## 2019-12-15 MED ORDER — ACETAMINOPHEN 500 MG PO TABS
1000.0000 mg | ORAL_TABLET | Freq: Once | ORAL | Status: AC
  Administered 2019-12-15: 1000 mg via ORAL

## 2019-12-15 MED ORDER — CELECOXIB 200 MG PO CAPS
ORAL_CAPSULE | ORAL | Status: AC
  Filled 2019-12-15: qty 1

## 2019-12-15 MED ORDER — ACETAMINOPHEN 500 MG PO TABS
ORAL_TABLET | ORAL | Status: AC
  Filled 2019-12-15: qty 2

## 2019-12-15 MED ORDER — CEFAZOLIN SODIUM-DEXTROSE 2-4 GM/100ML-% IV SOLN
INTRAVENOUS | Status: AC
  Filled 2019-12-15: qty 100

## 2019-12-15 MED ORDER — FENTANYL CITRATE (PF) 100 MCG/2ML IJ SOLN
INTRAMUSCULAR | Status: AC
  Filled 2019-12-15: qty 2

## 2019-12-15 MED ORDER — OXYCODONE HCL 5 MG PO TABS
ORAL_TABLET | ORAL | Status: AC
  Filled 2019-12-15: qty 1

## 2019-12-15 MED ORDER — DEXAMETHASONE SODIUM PHOSPHATE 10 MG/ML IJ SOLN
INTRAMUSCULAR | Status: DC | PRN
  Administered 2019-12-15: 10 mg via INTRAVENOUS

## 2019-12-15 MED ORDER — METHOCARBAMOL 1000 MG/10ML IJ SOLN
500.0000 mg | Freq: Once | INTRAVENOUS | Status: AC
  Administered 2019-12-15: 500 mg via INTRAVENOUS
  Filled 2019-12-15: qty 500

## 2019-12-15 MED ORDER — MIDAZOLAM HCL 5 MG/5ML IJ SOLN
INTRAMUSCULAR | Status: DC | PRN
  Administered 2019-12-15: 2 mg via INTRAVENOUS

## 2019-12-15 MED ORDER — BUPIVACAINE-EPINEPHRINE 0.5% -1:200000 IJ SOLN
INTRAMUSCULAR | Status: DC | PRN
  Administered 2019-12-15: 13 mL

## 2019-12-15 MED ORDER — LIDOCAINE 2% (20 MG/ML) 5 ML SYRINGE
INTRAMUSCULAR | Status: DC | PRN
  Administered 2019-12-15: 60 mg via INTRAVENOUS

## 2019-12-15 MED ORDER — LIDOCAINE HCL (PF) 2 % IJ SOLN
INTRAMUSCULAR | Status: AC
  Filled 2019-12-15: qty 5

## 2019-12-15 MED ORDER — OXYCODONE HCL 5 MG PO TABS
5.0000 mg | ORAL_TABLET | Freq: Once | ORAL | Status: AC
  Administered 2019-12-15: 5 mg via ORAL

## 2019-12-15 MED ORDER — FENTANYL CITRATE (PF) 100 MCG/2ML IJ SOLN
25.0000 ug | INTRAMUSCULAR | Status: DC | PRN
  Administered 2019-12-15 (×2): 25 ug via INTRAVENOUS

## 2019-12-15 MED ORDER — LACTATED RINGERS IV SOLN: INTRAVENOUS | Status: DC

## 2019-12-15 MED ORDER — AMISULPRIDE (ANTIEMETIC) 5 MG/2ML IV SOLN: 10.0000 mg | Freq: Once | INTRAVENOUS | Status: DC | PRN

## 2019-12-15 MED ORDER — ONDANSETRON HCL 4 MG/2ML IJ SOLN
INTRAMUSCULAR | Status: DC | PRN
  Administered 2019-12-15: 4 mg via INTRAVENOUS

## 2019-12-15 MED ORDER — DEXAMETHASONE SODIUM PHOSPHATE 10 MG/ML IJ SOLN
INTRAMUSCULAR | Status: AC
  Filled 2019-12-15: qty 1

## 2019-12-15 MED ORDER — MIDAZOLAM HCL 2 MG/2ML IJ SOLN
INTRAMUSCULAR | Status: AC
  Filled 2019-12-15: qty 2

## 2019-12-15 MED ORDER — OXYCODONE HCL 5 MG PO TABS
5.0000 mg | ORAL_TABLET | Freq: Three times a day (TID) | ORAL | 0 refills | Status: DC | PRN
Start: 2019-12-15 — End: 2020-11-11

## 2019-12-15 MED ORDER — SCOPOLAMINE 1 MG/3DAYS TD PT72
MEDICATED_PATCH | TRANSDERMAL | Status: AC
  Filled 2019-12-15: qty 1

## 2019-12-15 MED ORDER — HYDROMORPHONE HCL 2 MG/ML IJ SOLN
INTRAMUSCULAR | Status: AC
  Filled 2019-12-15: qty 1

## 2019-12-15 SURGICAL SUPPLY — 73 items
2.5 drill bit ×2 IMPLANT
BIT DRILL 100X2.5XANTM LCK (BIT) ×1 IMPLANT
BIT DRILL CAL (BIT) ×1 IMPLANT
BIT DRL 100X2.5XANTM LCK (BIT) ×1
BNDG COHESIVE 4X5 TAN STRL (GAUZE/BANDAGES/DRESSINGS) ×3 IMPLANT
BNDG COHESIVE 6X5 TAN STRL LF (GAUZE/BANDAGES/DRESSINGS) ×3 IMPLANT
BNDG ELASTIC 6X5.8 VLCR STR LF (GAUZE/BANDAGES/DRESSINGS) ×3 IMPLANT
CLOSURE WOUND 1/2 X4 (GAUZE/BANDAGES/DRESSINGS) ×1
COVER SURGICAL LIGHT HANDLE (MISCELLANEOUS) ×3 IMPLANT
CUFF TOURN SGL QUICK 34 (TOURNIQUET CUFF) ×3
CUFF TRNQT CYL 34X4.125X (TOURNIQUET CUFF) ×1 IMPLANT
DRAPE C-ARM 42X120 X-RAY (DRAPES) ×6 IMPLANT
DRAPE C-ARMOR (DRAPES) ×6 IMPLANT
DRAPE INCISE IOBAN 66X45 STRL (DRAPES) ×3 IMPLANT
DRAPE SHEET LG 3/4 BI-LAMINATE (DRAPES) ×3 IMPLANT
DRAPE U-SHAPE 47X51 STRL (DRAPES) ×3 IMPLANT
DRILL BIT 2.5MM (BIT) ×3
DRILL BIT CAL (BIT) ×3
DRSG AQUACEL AG ADV 3.5X10 (GAUZE/BANDAGES/DRESSINGS) ×3 IMPLANT
DRSG EMULSION OIL 3X16 NADH (GAUZE/BANDAGES/DRESSINGS) ×3 IMPLANT
DURAPREP 26ML APPLICATOR (WOUND CARE) ×3 IMPLANT
ELECT REM PT RETURN 9FT ADLT (ELECTROSURGICAL) ×3
ELECTRODE REM PT RTRN 9FT ADLT (ELECTROSURGICAL) ×1 IMPLANT
GAUZE SPONGE 4X4 12PLY STRL (GAUZE/BANDAGES/DRESSINGS) ×6 IMPLANT
GLOVE BIO SURGEON STRL SZ 6.5 (GLOVE) ×2 IMPLANT
GLOVE BIO SURGEON STRL SZ7.5 (GLOVE) ×6 IMPLANT
GLOVE BIO SURGEONS STRL SZ 6.5 (GLOVE) ×1
GLOVE BIOGEL PI IND STRL 7.0 (GLOVE) ×1 IMPLANT
GLOVE BIOGEL PI IND STRL 7.5 (GLOVE) ×1 IMPLANT
GLOVE BIOGEL PI INDICATOR 7.0 (GLOVE) ×2
GLOVE BIOGEL PI INDICATOR 7.5 (GLOVE) ×2
GLOVE INDICATOR 8.0 STRL GRN (GLOVE) ×6 IMPLANT
GOWN STRL REUS W/ TWL LRG LVL3 (GOWN DISPOSABLE) ×1 IMPLANT
GOWN STRL REUS W/TWL LRG LVL3 (GOWN DISPOSABLE) ×3
GOWN STRL REUS W/TWL XL LVL3 (GOWN DISPOSABLE) ×6 IMPLANT
IMMOBILIZER KNEE 20 (SOFTGOODS)
IMMOBILIZER KNEE 20 THIGH 36 (SOFTGOODS) IMPLANT
K-WIRE ACE 1.6X6 (WIRE) ×9
KIT TURNOVER CYSTO (KITS) ×3 IMPLANT
KWIRE ACE 1.6X6 (WIRE) ×3 IMPLANT
MANIFOLD NEPTUNE II (INSTRUMENTS) ×3 IMPLANT
NS IRRIG 1000ML POUR BTL (IV SOLUTION) ×3 IMPLANT
PACK BASIN DAY SURGERY FS (CUSTOM PROCEDURE TRAY) ×3 IMPLANT
PACK ORTHO EXTREMITY (CUSTOM PROCEDURE TRAY) ×3 IMPLANT
PAD CAST 4YDX4 CTTN HI CHSV (CAST SUPPLIES) ×2 IMPLANT
PADDING CAST COTTON 4X4 STRL (CAST SUPPLIES) ×6
PENCIL SMOKE EVACUATOR (MISCELLANEOUS) ×3 IMPLANT
PLATE LOCK 3H STD RT PROX TIB (Plate) ×3 IMPLANT
POSITIONER SURGICAL ARM (MISCELLANEOUS) ×3 IMPLANT
SCREW CORTICAL 3.5MM  34MM (Screw) ×2 IMPLANT
SCREW CORTICAL 3.5MM 34MM (Screw) ×1 IMPLANT
SCREW LOCK 3.5X70 DIST TIB (Screw) ×3 IMPLANT
SCREW LOCK 3.5X75 DIST TIB (Screw) ×3 IMPLANT
SCREW LOCK CORT STAR 3.5X60 (Screw) ×3 IMPLANT
SCREW LOCK CORT STAR 3.5X65 (Screw) ×6 IMPLANT
SCREW LP 3.5X70MM (Screw) ×3 IMPLANT
SCREW LP 3.5X75MM (Screw) ×3 IMPLANT
SET PAD KNEE POSITIONER (MISCELLANEOUS) ×3 IMPLANT
SPONGE LAP 18X18 RF (DISPOSABLE) ×6 IMPLANT
STOCKINETTE 6  STRL (DRAPES) ×2
STOCKINETTE 6 STRL (DRAPES) ×1 IMPLANT
STRIP CLOSURE SKIN 1/2X4 (GAUZE/BANDAGES/DRESSINGS) ×2 IMPLANT
SUCTION FRAZIER HANDLE 10FR (MISCELLANEOUS) ×2
SUCTION TUBE FRAZIER 10FR DISP (MISCELLANEOUS) ×1 IMPLANT
SUT ETHILON 4 0 PS 2 18 (SUTURE) ×3 IMPLANT
SUT MNCRL AB 3-0 PS2 27 (SUTURE) ×3 IMPLANT
SUT VIC AB 1 CT1 27 (SUTURE) ×15
SUT VIC AB 1 CT1 27XBRD ANTBC (SUTURE) ×5 IMPLANT
SUT VIC AB 2-0 CT2 27 (SUTURE) ×3 IMPLANT
SYR BULB IRRIG 60ML STRL (SYRINGE) ×3 IMPLANT
TOWEL OR 17X26 10 PK STRL BLUE (TOWEL DISPOSABLE) ×6 IMPLANT
WATER STERILE IRR 1000ML POUR (IV SOLUTION) ×3 IMPLANT
drill bit ×3 IMPLANT

## 2019-12-15 NOTE — Op Note (Signed)
Date of Surgery: 12/15/2019  INDICATIONS: Sherry Reynolds is a 29 y.o.-year-old female who sustained a right tibial plateau fracture; she was indicated for open reduction and internal fixation due to the displaced nature of the articular fracture and came to the operating room today for this procedure. The patient did consent to the procedure after discussion of the risks and benefits.  PREOPERATIVE DIAGNOSIS: right tibial plateau fracture (lateral condyle).  POSTOPERATIVE DIAGNOSIS: Same.  PROCEDURE: right tibial plateau open reduction and internal fixation.  CPT [phone]   SURGEON: Maryan Rued, M.D.  ASSIST: Dion Saucier, PA-C  Assistant attestation: PA Mcclung was utilized throughout the procedure for positioning the patient, approach to the fracture, adduction of fracture, and placement of deep implants.  He was also utilized for closure of wounds and application of brace and transfer back to PACU., .  ANESTHESIA:  general, and local  IV FLUIDS AND URINE: See anesthesia.  ESTIMATED BLOOD LOSS: 10 mL.  IMPLANTS: Biomet ALPS proximal tibial locking plate with 3.5 mm locking and nonlocking screws.   DRAINS: none  Tourniquet:  Right thigh at 250 mmHg times have 75 minutes.  COMPLICATIONS: None.  DESCRIPTION OF PROCEDURE: The patient was brought to the operating room and placed supine on the operating table.  The patient had been signed prior to the procedure and this was documented. The patient had the anesthesia placed by the anesthesiologist.  The prep verification and incision time-outs were performed to confirm that this was the correct patient, site, side and location. The patient had an SCD on the opposite lower extremity. The patient did receive antibiotics prior to the incision and was re-dosed during the procedure as needed at indicated intervals.  The patient had the lower extremity prepped and draped in the standard surgical fashion.  The bony landmarks were palpated and the  incision was drawn with a marker.  The incision was taken down through the skin and subcutaneous tissue with the knife down to the fascia. The fascia was then incised in line with the skin incision, taking care to extend through Gerdy's tubercle. The fascia was then taken anteriorly and posteriorly off of Gerdy's tubercle, and this exposed the joint capsule. The anterior compartment was also gently elevated from distal to proximal off of the tibia to expose the fracture, taking care to leave the fascia available for later repair.  Next a large periarticular clamp was placed at the proximal tibia and a small incision was made on the medial side to place this in the medial metaphysis area under fluoroscopy. This was done to reduce the widened condyles together.This was followed both under direct visualization of the joint as well as on AP and lateral fluoroscopic imaging to confirm that the joint was well reduced.    The lateral plate was then placed on the apex of the fracture and this was also clamped down with the peri-articular clamp.  All screws were then placed in the standard fashion, first drilling, then measuring with a depth gauge, and then placing the screws on power and tightening by hand. The cortical screw was first placed just distal to the apex to bring the plate to the bone and form an axilla for the fracture.  The most proximal locking screws were then all placed. The remaining distal cortical screws were then placed.  All screws were confirmed on both AP and lateral views including both the length and location. The wound was copiously irrigated. Then the deep fascia was closed with 0  Vicryl figure-of-eight interrupted suture. This was closed with no difficulty or tension in the anterior compartment. The subcutaneous layer was closed with 2-0 vicryl. The skin was then reapproximated with 3-0 subcuticular monocryl sutures. The wounds were cleaned and dried a final time. A sterile dressing was  placed. The patient was then wrapped in an Ace and placed in the unlocked hinged knee brace. The patient was then transferred back to the bed and left the operating room in stable condition.  All sponge and instrument counts were correct.  POSTOPERATIVE PLAN: Ms. Schroeter will remain nonweightbearing on this leg for approximately 6 weeks; she will return for suture removal in 2 weeks.  The hinged knee brace will remain on at this time, but will remain completely unlocked.  Ms. Gibbon will receive DVT prophylaxis based on other medications, activity level, and risk ratio of bleeding to thrombosis.  Due to her currently being pregnant, and wanting to avoid aspirin by mouth we have elected for Lovenox x4 weeks.

## 2019-12-15 NOTE — H&P (Signed)
ORTHOPAEDIC H and P  REQUESTING PHYSICIAN: Yolonda Kida, MD  PCP:  Patient, No Pcp Per  Chief Complaint: Right knee pain  HPI: Sherry Reynolds is a 29 y.o. female who complains of right knee pain following a accident while using a motorized skateboard.  This resulted in significant injury to the right knee.  She has been evaluated in my office and found to have a articularly displaced type I Schatzker tibial plateau fracture.  She is here today for open reduction internal fixation.  Past Medical History:  Diagnosis Date  . ADD (attention deficit disorder)   . History of miscarriage   . Tibial plateau fracture, right 12/07/2019   Past Surgical History:  Procedure Laterality Date  . BREAST ENHANCEMENT SURGERY Bilateral 2013 approx.  . WISDOM TOOTH EXTRACTION     Social History   Socioeconomic History  . Marital status: Married    Spouse name: Ivin Booty  . Number of children: Not on file  . Years of education: Not on file  . Highest education level: Not on file  Occupational History  . Not on file  Tobacco Use  . Smoking status: Former Smoker    Years: 10.00    Types: Cigarettes    Quit date: 12/11/2018    Years since quitting: 1.0  . Smokeless tobacco: Never Used  Vaping Use  . Vaping Use: Every day  . Devices: Huge  Substance and Sexual Activity  . Alcohol use: Not Currently    Alcohol/week: 2.0 - 3.0 standard drinks    Types: 2 - 3 Standard drinks or equivalent per week  . Drug use: Never  . Sexual activity: Yes    Partners: Male    Birth control/protection: None  Other Topics Concern  . Not on file  Social History Narrative  . Not on file   Social Determinants of Health   Financial Resource Strain:   . Difficulty of Paying Living Expenses: Not on file  Food Insecurity:   . Worried About Programme researcher, broadcasting/film/video in the Last Year: Not on file  . Ran Out of Food in the Last Year: Not on file  Transportation Needs:   . Lack of Transportation (Medical):  Not on file  . Lack of Transportation (Non-Medical): Not on file  Physical Activity:   . Days of Exercise per Week: Not on file  . Minutes of Exercise per Session: Not on file  Stress:   . Feeling of Stress : Not on file  Social Connections:   . Frequency of Communication with Friends and Family: Not on file  . Frequency of Social Gatherings with Friends and Family: Not on file  . Attends Religious Services: Not on file  . Active Member of Clubs or Organizations: Not on file  . Attends Banker Meetings: Not on file  . Marital Status: Not on file   Family History  Problem Relation Age of Onset  . Endometriosis Mother    No Known Allergies Prior to Admission medications   Medication Sig Start Date End Date Taking? Authorizing Provider  acetaminophen (TYLENOL) 500 MG tablet Take 500 mg by mouth every 6 (six) hours as needed.   Yes [provider]  ibuprofen (ADVIL) 200 MG tablet Take 200 mg by mouth every 6 (six) hours as needed.   Yes [provider]   No results found.  Positive ROS: All other systems have been reviewed and were otherwise negative with the exception of those mentioned in the HPI  and as above.  Physical Exam: General: Alert, no acute distress Cardiovascular: No pedal edema Respiratory: No cyanosis, no use of accessory musculature GI: No organomegaly, abdomen is soft and non-tender Skin: No lesions in the area of chief complaint Neurologic: Sensation intact distally Psychiatric: Patient is competent for consent with normal mood and affect Lymphatic: No axillary or cervical lymphadenopathy  MUSCULOSKELETAL:  Right lower extremity is warm and well-perfused.  Some swelling about the knee but otherwise no open skin wounds.  Distally neurovascularly intact.  Assessment: Closed Schatzker type I tibial plateau fracture, right. Closed fibular head fracture right  Plan: -Signa and I again reviewed the MRI and CT scan of the right  knee.  This does show articular step-off of greater than 2 mm with some concern for lateral instability of this Schatzker type I fracture.  Our recommendation was to go ahead with open reduction internal fixation to achieve articular surface compression and anatomic alignment.  - The risks, benefits, and alternatives were discussed with the patient. There are risks associated with the surgery including, but not limited to, problems with anesthesia (death), infection, differences in leg length/angulation/rotation, fracture of bones, loosening or failure of implants, malunion, nonunion, hematoma (blood accumulation) which may require surgical drainage, blood clots, pulmonary embolism, nerve injury (foot drop), and blood vessel injury. The patient understands these risks and elects to proceed.  -We will plan for placing her in a Bledsoe knee brace postoperatively where she will be able to discharge home from PACU.  She will be nonweightbearing for 6 weeks.  Yolonda Kida, MD Cell [phone]    12/15/2019 1:14 PM

## 2019-12-15 NOTE — Discharge Instructions (Signed)
-He will be strictly nonweightbearing to the right lower extremity for 6 weeks.  You should use crutches and/or a walker or wheelchair.  -You should elevate your right lower extremity with "toes above nose."  As much as possible throughout the day.  = Apply ice to the right knee for 30 minutes out of each hour that you are awake.  You should alternate 30 minutes on with 30 minutes off of the knee every hour.  -Take Tylenol around-the-clock with 2 extra strength tablets every 6 hours as needed.  For breakthrough pain use oxycodone as necessary.  -For the prevention of blood clots you will administer a Lovenox injection into the subcutaneous tissue as directed by your pharmacist once per day for 4 weeks.  -Maintain your postoperative bandages for 3 days.  At that time you can remove the Ace bandage and the cast padding (white wrap), but you will leave the incisional bandage in place.  This is a waterproof bandage and can be worn while in the shower.  Please do not submerge underwater with this bandage in place.  PAIN You will be expected to have a moderate amount of pain in the affected knee for approximately two weeks.  However, the first two to four days will be the most severe in terms of the pain you will experience.  Prescriptions have been provided for you to take as needed for the pain.  The pain can be markedly reduced by using the ice/compressive bandage given.  Exchange the ice packs whenever they thaw.  During the night, keep the bandage on because it will still provide some compression for the swelling.  Also, keep the leg elevated on pillows above your heart, and this will help alleviate the pain and swelling.  MEDICATION Prescriptions have been provided to take as needed for pain.   DRESSING Keep the current dressing as dry as possible.  Three days after your surgery, you may remove the ice/compressive wrap, and  dressing.  You may now take a shower, but do not scrub the sounds  directly with soap.  Let water rinse over these and gently wipe with your hand.  A slight amount of thin drainage can be normal at this time, and do not let it frighten you.  Reapply the ice/compressive wrap.  You may now repeat this every day each time you shower.  SYMPTOMS TO REPORT TO YOUR DOCTOR  -Extreme pain.  -Extreme swelling.  -Temperature above 101 degrees that does not come down with acetaminophen     (Tylenol).  -Any changes in the feeling, color or movement of your toes.  -Extreme redness, heat, swelling or drainage at your incision             -If you're unable to urinate 6 hrs after returning home    Post Anesthesia Home Care Instructions  Activity: Get plenty of rest for the remainder of the day. A responsible adult should stay with you for 24 hours following the procedure.  For the next 24 hours, DO NOT: -Drive a car -Advertising copywriter -Drink alcoholic beverages -Take any medication unless instructed by your physician -Make any legal decisions or sign important papers.  Meals: Start with liquid foods such as gelatin or soup. Progress to regular foods as tolerated. Avoid greasy, spicy, heavy foods. If nausea and/or vomiting occur, drink only clear liquids until the nausea and/or vomiting subsides. Call your physician if vomiting continues.  Special Instructions/Symptoms: Your throat may feel dry or sore from the anesthesia  or the breathing tube placed in your throat during surgery. If this causes discomfort, gargle with warm salt water. The discomfort should disappear within 24 hours.  If you had a scopolamine patch placed behind your ear for the management of post- operative nausea and/or vomiting:  1. The medication in the patch is effective for 72 hours, after which it should be removed.  Wrap patch in a tissue and discard in the trash. Wash hands thoroughly with soap and water. 2. You may remove the patch earlier than 72 hours if you experience unpleasant side  effects which may include dry mouth, dizziness or visual disturbances. 3. Avoid touching the patch. Wash your hands with soap and water after contact with the patch.    -Return to see Dr. Aundria Rud in the office in 2 weeks for routine care.

## 2019-12-15 NOTE — Anesthesia Postprocedure Evaluation (Signed)
Anesthesia Post Note  Patient: Sherry Reynolds  Procedure(s) Performed: OPEN REDUCTION INTERNAL FIXATION (ORIF) TIBIAL PLATEAU (Right Leg Lower)     Patient location during evaluation: PACU Anesthesia Type: General Level of consciousness: awake and alert Pain management: pain level controlled Vital Signs Assessment: post-procedure vital signs reviewed and stable Respiratory status: spontaneous breathing, nonlabored ventilation, respiratory function stable and patient connected to nasal cannula oxygen Cardiovascular status: blood pressure returned to baseline and stable Postop Assessment: no apparent nausea or vomiting Anesthetic complications: no   No complications documented.  Last Vitals:  Vitals:   12/15/19 1615 12/15/19 1630  BP: (!) 161/111 138/84  Pulse: (!) 117 86  Resp: (!) 28 15  Temp:    SpO2: 98% 91%    Last Pain:  Vitals:   12/15/19 1630  TempSrc:   PainSc: 8         RLE Motor Response: Purposeful movement;Responds to commands (12/15/19 1630) RLE Sensation: Pain;No numbness (12/15/19 1630)      [name]

## 2019-12-15 NOTE — Brief Op Note (Signed)
12/15/2019  3:09 PM  PATIENT:  Sherry Reynolds  29 y.o. female  PRE-OPERATIVE DIAGNOSIS:  Right lateral tibial palteau fracture  POST-OPERATIVE DIAGNOSIS:  Same  PROCEDURE:  OPEN REDUCTION INTERNAL FIXATION (ORIF) TIBIAL PLATEAU  SURGEON:  Yolonda Kida, MD   Assitant:  Dion Saucier, PA_C  ANESTHESIA:   General  COMPLICATIONS: None

## 2019-12-15 NOTE — Anesthesia Preprocedure Evaluation (Signed)
Anesthesia Evaluation  Patient identified by MRN, date of birth, ID band Patient awake    Reviewed: Allergy & Precautions, NPO status , Patient's Chart, lab work & pertinent test results  Airway Mallampati: II  TM Distance: >3 FB Neck ROM: Full    Dental  (+) Dental Advisory Given   Pulmonary former smoker,    breath sounds clear to auscultation       Cardiovascular negative cardio ROS   Rhythm:Regular Rate:Normal     Neuro/Psych negative neurological ROS     GI/Hepatic negative GI ROS, Neg liver ROS,   Endo/Other  negative endocrine ROS  Renal/GU negative Renal ROS     Musculoskeletal   Abdominal   Peds  Hematology negative hematology ROS (+)   Anesthesia Other Findings   Reproductive/Obstetrics                             Anesthesia Physical Anesthesia Plan  ASA: I  Anesthesia Plan: General   Post-op Pain Management:    Induction: Intravenous  PONV Risk Score and Plan: 3 and Dexamethasone, Ondansetron and Treatment may vary due to age or medical condition  Airway Management Planned: LMA  Additional Equipment: None  Intra-op Plan:   Post-operative Plan: Extubation in OR  Informed Consent: I have reviewed the patients History and Physical, chart, labs and discussed the procedure including the risks, benefits and alternatives for the proposed anesthesia with the patient or authorized representative who has indicated his/her understanding and acceptance.     Dental advisory given  Plan Discussed with: CRNA  Anesthesia Plan Comments:         Anesthesia Quick Evaluation

## 2019-12-15 NOTE — Transfer of Care (Signed)
Immediate Anesthesia Transfer of Care Note  Patient: Sherry Reynolds  Procedure(s) Performed: OPEN REDUCTION INTERNAL FIXATION (ORIF) TIBIAL PLATEAU (Right Leg Lower)  Patient Location: PACU  Anesthesia Type:General  Level of Consciousness: awake, alert  and oriented  Airway & Oxygen Therapy: Patient Spontanous Breathing and Patient connected to nasal cannula oxygen  Post-op Assessment: Report given to RN and Post -op Vital signs reviewed and stable  Post vital signs: Reviewed and stable  Last Vitals:  Vitals Value Taken Time  BP 139/85 12/15/19 1556  Temp    Pulse 119 12/15/19 1557  Resp 17 12/15/19 1557  SpO2 99 % 12/15/19 1557  Vitals shown include unvalidated device data.  Last Pain:  Vitals:   12/15/19 1310  TempSrc: Oral  PainSc: 0-No pain      Patients Stated Pain Goal: 4 (12/15/19 1310)  Complications: No complications documented.

## 2019-12-15 NOTE — Anesthesia Procedure Notes (Signed)
Procedure Name: LMA Insertion Date/Time: 12/15/2019 1:53 PM Performed by: Virjean Boman D, CRNA Pre-anesthesia Checklist: Patient identified, Emergency Drugs available, Suction available and Patient being monitored Patient Re-evaluated:Patient Re-evaluated prior to induction Oxygen Delivery Method: Circle system utilized Preoxygenation: Pre-oxygenation with 100% oxygen Induction Type: IV induction Ventilation: Mask ventilation without difficulty LMA: LMA inserted LMA Size: 4.0 Tube type: Oral Number of attempts: 1 Placement Confirmation: positive ETCO2 and breath sounds checked- equal and bilateral Tube secured with: Tape Dental Injury: Teeth and Oropharynx as per pre-operative assessment

## 2019-12-18 ENCOUNTER — Encounter (HOSPITAL_BASED_OUTPATIENT_CLINIC_OR_DEPARTMENT_OTHER): Payer: Self-pay | Admitting: Orthopedic Surgery

## 2020-02-09 DIAGNOSIS — Z4789 Encounter for other orthopedic aftercare: Secondary | ICD-10-CM | POA: Diagnosis not present

## 2020-03-22 DIAGNOSIS — M79671 Pain in right foot: Secondary | ICD-10-CM | POA: Diagnosis not present

## 2020-03-22 DIAGNOSIS — Z4789 Encounter for other orthopedic aftercare: Secondary | ICD-10-CM | POA: Diagnosis not present

## 2020-04-06 ENCOUNTER — Ambulatory Visit (INDEPENDENT_AMBULATORY_CARE_PROVIDER_SITE_OTHER): Payer: Medicaid Other | Admitting: Obstetrics and Gynecology

## 2020-04-06 ENCOUNTER — Other Ambulatory Visit: Payer: Self-pay

## 2020-04-06 ENCOUNTER — Other Ambulatory Visit (HOSPITAL_COMMUNITY)
Admission: RE | Admit: 2020-04-06 | Discharge: 2020-04-06 | Disposition: A | Discharge status: Home / Self Care | Payer: Medicaid Other | Source: Ambulatory Visit | Attending: Obstetrics and Gynecology | Admitting: Obstetrics and Gynecology

## 2020-04-06 ENCOUNTER — Encounter: Payer: Self-pay | Admitting: Obstetrics and Gynecology

## 2020-04-06 DIAGNOSIS — F988 Other specified behavioral and emotional disorders with onset usually occurring in childhood and adolescence: Secondary | ICD-10-CM

## 2020-04-06 DIAGNOSIS — Z124 Encounter for screening for malignant neoplasm of cervix: Secondary | ICD-10-CM | POA: Diagnosis not present

## 2020-04-06 DIAGNOSIS — N926 Irregular menstruation, unspecified: Secondary | ICD-10-CM | POA: Diagnosis not present

## 2020-04-06 DIAGNOSIS — F909 Attention-deficit hyperactivity disorder, unspecified type: Secondary | ICD-10-CM

## 2020-04-06 LAB — POCT URINE PREGNANCY: Preg Test, Ur: NEGATIVE

## 2020-04-06 NOTE — Progress Notes (Signed)
Postpartum Visit   Chief Complaint  Patient presents with  . Postpartum Care  . Amenorrhea   History of Present Illness: Patient is a 30 y.o. [id number] presents for postpartum visit.  Date of delivery: 08/13/2019 Type of delivery: Vaginal delivery - Vacuum or forceps assisted  no Episiotomy No.  Laceration: yes, 1st degree Pregnancy or labor problems:  no Any problems since the delivery:  She has been unable to get her Rx for Adderall since delivery. She has been told that she needs to see a psychiatrist prior to being on the medication.  She has taken 10-15 mg Adderall XR without issues.    She also hasn't had a period.  She quit breastfeeding about a month ago.  She is doing nothing for birth control.  She is OK with getting pregnant.    Newborn Details:  SINGLETON :  1. Baby's name: Jet. Birth weight: 3,520 grams (7 lb 12 oz) Maternal Details:  Breast Feeding:  No longer Post partum depression/anxiety noted:  no Edinburgh Post-Partum Depression Score:  6  Date of last PAP: 03/2017  normal   Past Medical History:  Diagnosis Date  . ADD (attention deficit disorder)   . History of miscarriage   . Tibial plateau fracture, right 12/07/2019    Past Surgical History:  Procedure Laterality Date  . BREAST ENHANCEMENT SURGERY Bilateral 2013 approx.  . ORIF TIBIA PLATEAU Right 12/15/2019   Procedure: OPEN REDUCTION INTERNAL FIXATION (ORIF) TIBIAL PLATEAU;  Surgeon: Yolonda Kida, MD;  Location: [facility];  Service: Orthopedics;  Laterality: Right;  . WISDOM TOOTH EXTRACTION      Prior to Admission medications: Denies   Allergies: No Known Allergies   Social History   Socioeconomic History  . Marital status: Married    Spouse name: Ivin Booty  . Number of children: Not on file  . Years of education: Not on file  . Highest education level: Not on file  Occupational History  . Not on file  Tobacco Use  . Smoking status: Former Smoker    Years: 10.00     Types: Cigarettes    Quit date: 12/11/2018    Years since quitting: 1.3  . Smokeless tobacco: Never Used  Vaping Use  . Vaping Use: Every day  . Devices: Huge  Substance and Sexual Activity  . Alcohol use: Not Currently    Alcohol/week: 2.0 - 3.0 standard drinks    Types: 2 - 3 Standard drinks or equivalent per week  . Drug use: Never  . Sexual activity: Yes    Partners: Male    Birth control/protection: None  Other Topics Concern  . Not on file  Social History Narrative  . Not on file   Social Determinants of Health   Financial Resource Strain: Not on file  Food Insecurity: Not on file  Transportation Needs: Not on file  Physical Activity: Not on file  Stress: Not on file  Social Connections: Not on file  Intimate Partner Violence: Not on file    Family History  Problem Relation Age of Onset  . Endometriosis Mother     Review of Systems  Constitutional: Negative.   HENT: Negative.   Eyes: Negative.   Respiratory: Negative.   Cardiovascular: Negative.   Gastrointestinal: Negative.   Genitourinary: Negative.   Musculoskeletal: Negative.   Skin: Negative.   Neurological: Negative.   Psychiatric/Behavioral: Negative.      Physical Exam BP 113/70   Ht 5\' 2"  (1.575 m)   Wt  137 lb (62.1 kg)   BMI 25.06 kg/m   Physical Exam Constitutional:      General: She is not in acute distress.    Appearance: Normal appearance. She is well-developed.  Genitourinary:     Vulva, bladder and urethral meatus normal.     Right Labia: No rash, tenderness, lesions, skin changes or Bartholin's cyst.    Left Labia: No tenderness, skin changes, Bartholin's cyst or rash.    No inguinal adenopathy present in the right or left side.    Pelvic Tanner Score: 5/5.     Right Adnexa: not tender, not full and no mass present.    Left Adnexa: not tender, not full and no mass present.    No cervical motion tenderness, friability, lesion or polyp.     Uterus is not enlarged, fixed or  tender.     Uterus is anteverted.     No urethral tenderness or mass present.     Pelvic exam was performed with patient in the lithotomy position.  HENT:     Head: Normocephalic and atraumatic.  Eyes:     General: No scleral icterus.    Conjunctiva/sclera: Conjunctivae normal.  Cardiovascular:     Rate and Rhythm: Normal rate and regular rhythm.     Heart sounds: No murmur heard. No friction rub. No gallop.   Pulmonary:     Effort: Pulmonary effort is normal. No respiratory distress.     Breath sounds: Normal breath sounds. No wheezing or rales.  Abdominal:     General: Bowel sounds are normal. There is no distension.     Palpations: Abdomen is soft. There is no mass.     Tenderness: There is no abdominal tenderness. There is no guarding or rebound.     Hernia: There is no hernia in the left inguinal area or right inguinal area.  Musculoskeletal:        General: Normal range of motion.     Cervical back: Normal range of motion and neck supple.  Lymphadenopathy:     Lower Body: No right inguinal adenopathy. No left inguinal adenopathy.  Neurological:     General: No focal deficit present.     Mental Status: She is alert and oriented to person, place, and time.     Cranial Nerves: No cranial nerve deficit.  Skin:    General: Skin is warm and dry.     Findings: No erythema.  Psychiatric:        Mood and Affect: Mood normal.        Behavior: Behavior normal.        Judgment: Judgment normal.     Female Chaperone present during breast and/or pelvic exam.  Urine pregnancy test: negative  Assessment: 30 y.o. [id number] presenting for 6 week postpartum visit  Plan: Problem List Items Addressed This Visit      Other   ADD (attention deficit disorder)    Other Visit Diagnoses    Postpartum care and examination    -  Primary   Relevant Orders   Cytology - PAP   Pap smear for cervical cancer screening       Relevant Orders   Cytology - PAP   Missed period       Relevant  Orders   POCT urine pregnancy (Completed)     1) Contraception Education given regarding options for contraception, including none at this time.  2)  Pap - ASCCP guidelines and rational discussed.  Patient opts for routine screening  interval. Due today.  3) Patient underwent screening for postpartum depression with no concerns noted.  4) ADHD: Restart Adderall XL 10 mg.  She has a well-documented history of taking this medication without issues and with benefit significant benefit. I reviewed notes from [facility] where she was prescribed this medication since 2015.  She has no family or personal medical history that would put her at risk with taking this medication.  She has no history of abuse of this medication.  Precautions of using the medication if she were to get pregnant.   5) Follow up 1 year for routine annual exam  Thomasene Mohair, MD 04/06/2020 10:44 AM

## 2020-04-07 ENCOUNTER — Telehealth: Payer: Self-pay

## 2020-04-07 MED ORDER — ADDERALL XR 10 MG PO CP24: 10.0000 mg | ORAL_CAPSULE | Freq: Every day | ORAL | 0 refills | Status: DC

## 2020-04-07 NOTE — Telephone Encounter (Signed)
Pt calling; has rx been sent in and if so to what pharmacy was it sent.  [phone]  Left detailed msg SDJ on call today; will send msg to him; pharm [facility]; if needs a diff pharm to call and let us know.

## 2020-04-08 LAB — CYTOLOGY - PAP
Comment: NEGATIVE
Diagnosis: NEGATIVE
High risk HPV: NEGATIVE

## 2020-04-08 NOTE — Telephone Encounter (Signed)
Pt aware.

## 2020-04-08 NOTE — Telephone Encounter (Signed)
Please let patient know that [facility] [place] yesterday. Thanks

## 2020-05-04 ENCOUNTER — Telehealth: Payer: Self-pay

## 2020-05-04 ENCOUNTER — Ambulatory Visit: Payer: Medicaid Other | Admitting: Obstetrics and Gynecology

## 2020-05-04 NOTE — Telephone Encounter (Signed)
Pt calling; missed appt today; rescheduled for Monday; will run out of med Fri or Sat; does he want to send more in?  [phone]  Pt is talking about Adderall XR 10mg .  [facility]

## 2020-05-05 ENCOUNTER — Other Ambulatory Visit: Payer: Self-pay | Admitting: Obstetrics and Gynecology

## 2020-05-05 DIAGNOSIS — F988 Other specified behavioral and emotional disorders with onset usually occurring in childhood and adolescence: Secondary | ICD-10-CM

## 2020-05-05 MED ORDER — ADDERALL XR 10 MG PO CP24: 10.0000 mg | ORAL_CAPSULE | Freq: Every day | ORAL | 0 refills | Status: DC

## 2020-05-09 ENCOUNTER — Encounter: Payer: Self-pay | Admitting: Obstetrics and Gynecology

## 2020-05-09 ENCOUNTER — Ambulatory Visit (INDEPENDENT_AMBULATORY_CARE_PROVIDER_SITE_OTHER): Payer: Medicaid Other | Admitting: Obstetrics and Gynecology

## 2020-05-09 ENCOUNTER — Other Ambulatory Visit: Payer: Self-pay

## 2020-05-09 DIAGNOSIS — F988 Other specified behavioral and emotional disorders with onset usually occurring in childhood and adolescence: Secondary | ICD-10-CM | POA: Diagnosis not present

## 2020-05-09 NOTE — Telephone Encounter (Signed)
Refill eRx'd by SDJ.

## 2020-05-09 NOTE — Progress Notes (Signed)
  Virtual Visit via Telephone Note  I connected with Sherry Reynolds on 05/09/20 at 11:30 AM EDT by telephone and verified that I am speaking with the correct person using two identifiers.   I discussed the limitations, risks, security and privacy concerns of performing an evaluation and management service by telephone and the availability of in person appointments. I also discussed with the patient that there may be a patient responsible charge related to this service. The patient expressed understanding and agreed to proceed.  The patient was at home I spoke with the patient from my  office The names of people involved in this encounter were: Sherry Reynolds and Thomasene Mohair, MD.   History of Present Illness: 30 y.o. [phone] female who presents for ongoing management of ADHD while trying to establish care longer-term.  She is currently taking Adderall XR 10 mg capsule. She is doing well on the medication. She note a big difference and believes it is helping. She is not sure whether it is helping as much as possible, but is still significantly better and she is happy with the level of improvement.  She still hasn't had a period since stopping breastfeeding. She took a pregnancy test recently and it was negative.    Observations/Objective: Physical Exam could not be performed. Because of the COVID-19 outbreak this visit was performed over the phone and not in person.   Assessment and Plan:   ICD-10-CM   1. 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  Will wait one more month for spontaneous menses.     I discussed the assessment and treatment plan with the patient. The patient was provided an opportunity to ask questions and all were answered. The patient agreed with the plan and demonstrated an understanding of the instructions.   The patient was advised to call back or seek an in-person  evaluation if the symptoms worsen or if the condition fails to improve as anticipated.  I provided 16.5 minutes of non-face-to-face time during this encounter.  Thomasene Mohair, MD  [facility], [facility] 05/09/2020 12:08 PM

## 2020-05-30 ENCOUNTER — Other Ambulatory Visit: Payer: Self-pay

## 2020-05-30 ENCOUNTER — Encounter: Payer: Self-pay | Admitting: Obstetrics and Gynecology

## 2020-05-30 ENCOUNTER — Ambulatory Visit (INDEPENDENT_AMBULATORY_CARE_PROVIDER_SITE_OTHER): Payer: Medicaid Other | Admitting: Obstetrics and Gynecology

## 2020-05-30 DIAGNOSIS — F988 Other specified behavioral and emotional disorders with onset usually occurring in childhood and adolescence: Secondary | ICD-10-CM

## 2020-05-30 MED ORDER — ADDERALL XR 10 MG PO CP24: 10.0000 mg | ORAL_CAPSULE | Freq: Every day | ORAL | 0 refills | Status: DC

## 2020-05-30 NOTE — Progress Notes (Signed)
  Virtual Visit via Telephone Note  I connected with Sherry Reynolds on 05/30/20 at 11:10 AM EDT by telephone and verified that I am speaking with the correct person using two identifiers.   I discussed the limitations, risks, security and privacy concerns of performing an evaluation and management service by telephone and the availability of in person appointments. I also discussed with the patient that there may be a patient responsible charge related to this service. The patient expressed understanding and agreed to proceed.  The patient was at home I spoke with the patient from my  office The names of people involved in this encounter were: Sherry Reynolds and Thomasene Mohair, MD.   History of Present Illness: 30 y.o. [phone] female who presents for ongoing management of ADHD while trying to establish care longer-term.  She is currently taking Adderall XR 10 mg capsule. She is doing well on the medication.  Two or three days after our last conversation, she had her menses. She still has a hard time finding a doctor that will accept new patients. She was able to get in with Dr. Judithann Sheen on 06/09/2020.   Observations/Objective: Physical Exam could not be performed. Because of the COVID-19 outbreak this visit was performed over the phone and not in person.   Assessment and Plan:   ICD-10-CM   1. 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 Will send a refill so there is no gap in treatment.   I discussed the assessment and treatment plan with the patient. The patient was provided an opportunity to ask questions and all were answered. The patient agreed with the plan and demonstrated an understanding of the instructions.   The patient was advised to call back or seek an in-person evaluation if the symptoms worsen or if the condition fails to improve as anticipated.  I provided 12.5 minutes of non-face-to-face time  during this encounter.  Thomasene Mohair, MD  [facility], [facility] 05/30/2020 11:38 AM

## 2020-06-09 DIAGNOSIS — Z0000 Encounter for general adult medical examination without abnormal findings: Secondary | ICD-10-CM | POA: Diagnosis not present

## 2020-06-09 DIAGNOSIS — F902 Attention-deficit hyperactivity disorder, combined type: Secondary | ICD-10-CM | POA: Diagnosis not present

## 2020-07-01 ENCOUNTER — Other Ambulatory Visit: Payer: Self-pay | Admitting: Obstetrics

## 2020-07-01 DIAGNOSIS — O219 Vomiting of pregnancy, unspecified: Secondary | ICD-10-CM

## 2020-07-01 MED ORDER — ONDANSETRON 4 MG PO TBDP: 4.0000 mg | ORAL_TABLET | Freq: Three times a day (TID) | ORAL | 0 refills | Status: DC | PRN

## 2020-07-09 ENCOUNTER — Other Ambulatory Visit: Payer: Self-pay | Admitting: Obstetrics and Gynecology

## 2020-07-09 DIAGNOSIS — O219 Vomiting of pregnancy, unspecified: Secondary | ICD-10-CM

## 2020-07-09 MED ORDER — ONDANSETRON 4 MG PO TBDP: 4.0000 mg | ORAL_TABLET | Freq: Three times a day (TID) | ORAL | 1 refills | Status: DC | PRN

## 2020-07-14 ENCOUNTER — Encounter: Payer: Medicaid Other | Admitting: Obstetrics and Gynecology

## 2020-07-18 ENCOUNTER — Encounter: Payer: Medicaid Other | Admitting: Obstetrics and Gynecology

## 2020-07-27 ENCOUNTER — Ambulatory Visit (INDEPENDENT_AMBULATORY_CARE_PROVIDER_SITE_OTHER): Payer: Medicaid Other | Admitting: Obstetrics and Gynecology

## 2020-07-27 ENCOUNTER — Other Ambulatory Visit (HOSPITAL_COMMUNITY)
Admission: RE | Admit: 2020-07-27 | Discharge: 2020-07-27 | Disposition: A | Discharge status: Home / Self Care | Payer: Medicaid Other | Source: Ambulatory Visit | Attending: Obstetrics and Gynecology | Admitting: Obstetrics and Gynecology

## 2020-07-27 ENCOUNTER — Encounter: Payer: Self-pay | Admitting: Obstetrics and Gynecology

## 2020-07-27 ENCOUNTER — Other Ambulatory Visit: Payer: Self-pay

## 2020-07-27 VITALS — BP 119/81 | Ht 62.0 in | Wt 126.0 lb

## 2020-07-27 DIAGNOSIS — Z113 Encounter for screening for infections with a predominantly sexual mode of transmission: Secondary | ICD-10-CM

## 2020-07-27 DIAGNOSIS — Z3A09 9 weeks gestation of pregnancy: Secondary | ICD-10-CM | POA: Diagnosis not present

## 2020-07-27 DIAGNOSIS — N926 Irregular menstruation, unspecified: Secondary | ICD-10-CM

## 2020-07-27 DIAGNOSIS — Z3481 Encounter for supervision of other normal pregnancy, first trimester: Secondary | ICD-10-CM | POA: Diagnosis not present

## 2020-07-27 DIAGNOSIS — Z3480 Encounter for supervision of other normal pregnancy, unspecified trimester: Secondary | ICD-10-CM | POA: Insufficient documentation

## 2020-07-27 LAB — POCT URINALYSIS DIPSTICK OB
Glucose, UA: NEGATIVE
POC,PROTEIN,UA: NEGATIVE

## 2020-07-27 LAB — POCT URINE PREGNANCY: Preg Test, Ur: POSITIVE — AB

## 2020-07-27 NOTE — Progress Notes (Signed)
New Obstetric Patient H&P   Chief Complaint: "Desires prenatal care"   History of Present Illness: Patient is a 30 y.o. Q7Y1950 [id number] female, uncertain LMP 05/12/2020(could have been 05/14/2020) presents with amenorrhea and positive home pregnancy test. Based on her  LMP, her EDD is Estimated Date of Delivery: 02/16/21 and her EGA is [redacted]w[redacted]d. She only had one period. She breast fed for 7-8 months. Her last pap smear was 4 months ago and was no abnormalities.    She had a urine pregnancy test which was positive 4 week(s)  ago. Since her LMP she claims she has experienced nausea/vomiting. She had a small episode of dark spotting. Her past medical history is notable for ADHD. Her prior pregnancies are notable for none  Since her LMP, she admits to the use of tobacco products  no.  She quit prior to her last pregnancy.  She claims she has gained zero pounds since the start of her pregnancy.  There are cats in the home in the home  no  She admits close contact with children on a regular basis  yes  She has had chicken pox in the past no She has had Tuberculosis exposures, symptoms, or previously tested positive for TB   no Current or past history of domestic violence. no  Genetic Screening/Teratology Counseling: (Includes patient, baby's father, or anyone in either family with:)   1. Patient's age >/= 91 at [facility]  no 2. Thalassemia ([place], [place], Mediterranean, or Asian background): MCV<80  no 3. Neural tube defect (meningomyelocele, spina bifida, anencephaly)  no 4. Congenital heart defect  no  5. Down syndrome  no 6. Tay-Sachs (Jewish, [place]  no 7. Canavan's Disease  no 8. Sickle cell disease or trait (African)  no  9. Hemophilia or other blood disorders  no  10. Muscular dystrophy  no  11. Cystic fibrosis  no  12. Huntington's Chorea  no  13. Mental retardation/autism  no 14. Other inherited genetic or chromosomal disorder  no 15. Maternal metabolic disorder (DM, PKU, etc)   no 16. Patient or FOB with a child with a birth defect not listed above no  16a. Patient or FOB with a birth defect themselves no 17. Recurrent pregnancy loss, or stillbirth  yes  18. Any medications since LMP other than prenatal vitamins (include vitamins, supplements, OTC meds, drugs, alcohol)  yes, Adderal 19. Any other genetic/environmental exposure to discuss  no  Infection History:   1. Lives with someone with TB or TB exposed  no  2. Patient or partner has history of genital herpes  no 3. Rash or viral illness since LMP  no 4. History of STI (GC, CT, HPV, syphilis, HIV)  no 5. History of recent travel :  no  Other pertinent information:  no     Review of Systems:10 point review of systems negative unless otherwise noted in HPI  Past Medical History:  Diagnosis Date   ADD (attention deficit disorder)    History of miscarriage    Tibial plateau fracture, right 12/07/2019    Past Surgical History:  Procedure Laterality Date   BREAST ENHANCEMENT SURGERY Bilateral 2013 approx.   ORIF TIBIA PLATEAU Right 12/15/2019   Procedure: OPEN REDUCTION INTERNAL FIXATION (ORIF) TIBIAL PLATEAU;  Surgeon: Yolonda Kida, MD;  [facility];  Service: Orthopedics;  Laterality: Right;   WISDOM TOOTH EXTRACTION      Gynecologic History: Patient's last menstrual period was 05/12/2020.  Obstetric History: [id number]  Family History  Problem Relation Age of Onset   Endometriosis Mother     Social History   Socioeconomic History   Marital status: Married    Spouse name: Ivin Booty   Number of children: Not on file   Years of education: Not on file   Highest education level: Not on file  Occupational History   Not on file  Tobacco Use   Smoking status: Former    Years: 10.00    Types: Cigarettes    Quit date: 12/11/2018    Years since quitting: 1.6   Smokeless tobacco: Never  Vaping Use   Vaping Use: Every day   Devices: Huge  Substance and Sexual  Activity   Alcohol use: Not Currently    Alcohol/week: 2.0 - 3.0 standard drinks    Types: 2 - 3 Standard drinks or equivalent per week   Drug use: Never   Sexual activity: Yes    Partners: Male    Birth control/protection: None  Other Topics Concern   Not on file  Social History Narrative   Not on file   Social Determinants of Health   Financial Resource Strain: Not on file  Food Insecurity: Not on file  Transportation Needs: Not on file  Physical Activity: Not on file  Stress: Not on file  Social Connections: Not on file  Intimate Partner Violence: Not on file    No Known Allergies  Prior to Admission medications   Medication Sig Start Date End Date Taking? Authorizing Provider  ADDERALL XR 10 MG 24 hr capsule Take 1 capsule (10 mg total) by mouth daily. 05/30/20  Yes Conard Novak, MD  ondansetron (ZOFRAN ODT) 4 MG disintegrating tablet Take 1 tablet (4 mg total) by mouth every 8 (eight) hours as needed for nausea or vomiting. 07/09/20  Yes Conard Novak, MD    Physical Exam BP 119/81   Ht 5\' 2"  (1.575 m)   Wt 126 lb (57.2 kg)   LMP 05/12/2020   BMI 23.05 kg/m   Physical Exam Constitutional:      General: She is not in acute distress.    Appearance: Normal appearance. She is well-developed.  HENT:     Head: Normocephalic and atraumatic.  Eyes:     General: No scleral icterus.    Conjunctiva/sclera: Conjunctivae normal.  Cardiovascular:     Rate and Rhythm: Normal rate and regular rhythm.     Heart sounds: No murmur heard.   No friction rub. No gallop.  Pulmonary:     Effort: Pulmonary effort is normal. No respiratory distress.     Breath sounds: Normal breath sounds. No wheezing or rales.  Abdominal:     General: Bowel sounds are normal. There is no distension.     Palpations: Abdomen is soft. There is no mass.     Tenderness: There is no abdominal tenderness. There is no guarding or rebound.  Musculoskeletal:        General: Normal range of  motion.     Cervical back: Normal range of motion and neck supple.  Neurological:     General: No focal deficit present.     Mental Status: She is alert and oriented to person, place, and time.     Cranial Nerves: No cranial nerve deficit.  Skin:    General: Skin is warm and dry.     Findings: No erythema.  Psychiatric:        Mood and Affect: Mood normal.        Behavior:  Behavior normal.        Judgment: Judgment normal.   Female Chaperone present during breast and/or pelvic exam.  Bedside ultrasound today (transabdominal): Single living IUP with CRL consistent with EGA of [redacted]w[redacted]d, EDD 03/01/2021. This changes EDD. Cardiac activity noted and normal. No abnormalities noted.  Assessment: 30 y.o. [id number] at [redacted]w[redacted]d presenting to initiate prenatal care  Plan: 1) Avoid alcoholic beverages. 2) Patient encouraged not to smoke.  3) Discontinue the use of all non-medicinal drugs and chemicals.  4) Take prenatal vitamins daily.  5) Nutrition, food safety (fish, cheese advisories, and high nitrite foods) and exercise discussed. 6) Hospital and practice style discussed with cross coverage system.  7) Genetic Screening, such as with 1st Trimester Screening, cell free fetal DNA, AFP testing, and Ultrasound, as well as with amniocentesis and CVS as appropriate, is discussed with patient. At the conclusion of today's visit patient requested genetic testing 8) Patient is asked about travel to areas at risk for the Zika virus, and counseled to avoid travel and exposure to mosquitoes or sexual partners who may have themselves been exposed to the virus. Testing is discussed, and will be ordered as appropriate.   Thomasene Mohair, MD 07/27/2020 2:14 PM

## 2020-07-29 LAB — CERVICOVAGINAL ANCILLARY ONLY
Chlamydia: NEGATIVE
Comment: NEGATIVE
Comment: NORMAL
Neisseria Gonorrhea: NEGATIVE

## 2020-08-08 ENCOUNTER — Other Ambulatory Visit: Payer: Self-pay | Admitting: Obstetrics and Gynecology

## 2020-08-08 DIAGNOSIS — O219 Vomiting of pregnancy, unspecified: Secondary | ICD-10-CM

## 2020-08-08 MED ORDER — ONDANSETRON 4 MG PO TBDP: 4.0000 mg | ORAL_TABLET | Freq: Three times a day (TID) | ORAL | 1 refills | Status: DC | PRN

## 2020-08-17 ENCOUNTER — Encounter: Payer: Self-pay | Admitting: Obstetrics and Gynecology

## 2020-08-17 ENCOUNTER — Other Ambulatory Visit: Payer: Self-pay

## 2020-08-17 ENCOUNTER — Ambulatory Visit (INDEPENDENT_AMBULATORY_CARE_PROVIDER_SITE_OTHER): Payer: Medicaid Other | Admitting: Obstetrics and Gynecology

## 2020-08-17 VITALS — BP 112/70 | Ht 62.0 in | Wt 126.4 lb

## 2020-08-17 DIAGNOSIS — Z3A12 12 weeks gestation of pregnancy: Secondary | ICD-10-CM

## 2020-08-17 DIAGNOSIS — O219 Vomiting of pregnancy, unspecified: Secondary | ICD-10-CM

## 2020-08-17 DIAGNOSIS — Z3481 Encounter for supervision of other normal pregnancy, first trimester: Secondary | ICD-10-CM

## 2020-08-17 NOTE — Progress Notes (Signed)
  Routine Prenatal Care Visit  Subjective  Sherry Reynolds is a 30 y.o. [phone] at [redacted]w[redacted]d being seen today for ongoing prenatal care.  She is currently monitored for the following issues for this low-risk pregnancy and has ADD (attention deficit disorder); Anxiety and depression; Bilateral bunions; Family history of ovarian cancer; Tobacco use; Other specified behavioral and emotional disorders with onset usually occurring in childhood and adolescence; History of recurrent miscarriages; Nausea and vomiting during pregnancy; Palpitations; and Supervision of other normal pregnancy, antepartum on their problem list.  ----------------------------------------------------------------------------------- Patient reports no complaints.    . Vag. Bleeding: None.   . Leaking Fluid denies.  ----------------------------------------------------------------------------------- The following portions of the patient's history were reviewed and updated as appropriate: allergies, current medications, past family history, past medical history, past social history, past surgical history and problem list. Problem list updated.  Objective  Blood pressure 112/70, height 5\' 2"  (1.575 m), weight 126 lb 6.4 oz (57.3 kg), last menstrual period 05/12/2020, currently breastfeeding. Pregravid weight Pregravid weight not on file Total Weight Gain Not found. Urinalysis: Urine Protein    Urine Glucose    Fetal Status: Fetal Heart Rate (bpm): 172         General:  Alert, oriented and cooperative. Patient is in no acute distress.  Skin: Skin is warm and dry. No rash noted.   Cardiovascular: Normal heart rate noted  Respiratory: Normal respiratory effort, no problems with respiration noted  Abdomen: Soft, gravid, appropriate for gestational age. Pain/Pressure: Present     Pelvic:  Cervical exam deferred        Extremities: Normal range of motion.     Mental Status: Normal mood and affect. Normal behavior. Normal judgment and  thought content.   Assessment   30 y.o. 26 at [redacted]w[redacted]d by  03/01/2021, by Ultrasound presenting for routine prenatal visit  Plan   pregnancy 7 Problems (from 07/27/20 to present)     Problem Noted Resolved   Supervision of other normal pregnancy, antepartum 07/27/2020 by 07/29/2020, MD No        Preterm labor symptoms and general obstetric precautions including but not limited to vaginal bleeding, contractions, leaking of fluid and fetal movement were reviewed in detail with the patient. Please refer to After Visit Summary for other counseling recommendations.   Return in about 4 weeks (around 09/14/2020) for Routine Prenatal Appointment (schedule [email or web address] at pt's next convenience).   11/14/2020, MD, [facility], [facility] 08/17/2020 4:55 PM

## 2020-08-18 ENCOUNTER — Other Ambulatory Visit: Payer: Medicaid Other

## 2020-08-18 ENCOUNTER — Other Ambulatory Visit: Payer: Self-pay | Admitting: Obstetrics & Gynecology

## 2020-08-18 DIAGNOSIS — Z1379 Encounter for other screening for genetic and chromosomal anomalies: Secondary | ICD-10-CM | POA: Diagnosis not present

## 2020-08-24 LAB — MATERNIT21 PLUS CORE+SCA
Fetal Fraction: 9
Monosomy X (Turner Syndrome): NOT DETECTED
Result (T21): NEGATIVE
Trisomy 13 (Patau syndrome): NEGATIVE
Trisomy 18 (Edwards syndrome): NEGATIVE
Trisomy 21 (Down syndrome): NEGATIVE
XXX (Triple X Syndrome): NOT DETECTED
XXY (Klinefelter Syndrome): NOT DETECTED
XYY (Jacobs Syndrome): NOT DETECTED

## 2020-08-24 LAB — SPECIMEN STATUS REPORT

## 2020-09-16 ENCOUNTER — Other Ambulatory Visit: Payer: Self-pay

## 2020-09-16 ENCOUNTER — Ambulatory Visit (INDEPENDENT_AMBULATORY_CARE_PROVIDER_SITE_OTHER): Payer: Medicaid Other | Admitting: Obstetrics and Gynecology

## 2020-09-16 ENCOUNTER — Encounter: Payer: Self-pay | Admitting: Obstetrics and Gynecology

## 2020-09-16 VITALS — BP 118/74 | Wt 129.0 lb

## 2020-09-16 DIAGNOSIS — Z3A16 16 weeks gestation of pregnancy: Secondary | ICD-10-CM

## 2020-09-16 DIAGNOSIS — Z3480 Encounter for supervision of other normal pregnancy, unspecified trimester: Secondary | ICD-10-CM

## 2020-09-16 DIAGNOSIS — O219 Vomiting of pregnancy, unspecified: Secondary | ICD-10-CM

## 2020-09-16 NOTE — Progress Notes (Signed)
  Routine Prenatal Care Visit  Subjective  Sherry Reynolds is a 30 y.o. [phone] at [redacted]w[redacted]d being seen today for ongoing prenatal care.  She is currently monitored for the following issues for this low-risk pregnancy and has ADD (attention deficit disorder); Anxiety and depression; Bilateral bunions; Family history of ovarian cancer; Tobacco use; Other specified behavioral and emotional disorders with onset usually occurring in childhood and adolescence; History of recurrent miscarriages; Nausea and vomiting during pregnancy; Palpitations; and Supervision of other normal pregnancy, antepartum on their problem list.  ----------------------------------------------------------------------------------- Patient reports no complaints.    . Vag. Bleeding: None.  Movement: Absent. Leaking Fluid denies.  ----------------------------------------------------------------------------------- The following portions of the patient's history were reviewed and updated as appropriate: allergies, current medications, past family history, past medical history, past social history, past surgical history and problem list. Problem list updated.  Objective  Blood pressure 118/74, weight 129 lb (58.5 kg), last menstrual period 05/12/2020, currently breastfeeding. Pregravid weight Pregravid weight not on file Total Weight Gain Not found. Urinalysis: Urine Protein    Urine Glucose    Fetal Status: Fetal Heart Rate (bpm): 155   Movement: Absent     General:  Alert, oriented and cooperative. Patient is in no acute distress.  Skin: Skin is warm and dry. No rash noted.   Cardiovascular: Normal heart rate noted  Respiratory: Normal respiratory effort, no problems with respiration noted  Abdomen: Soft, gravid, appropriate for gestational age. Pain/Pressure: Absent     Pelvic:  Cervical exam deferred        Extremities: Normal range of motion.     Mental Status: Normal mood and affect. Normal behavior. Normal judgment and thought  content.   Assessment   30 y.o. Y1O1751 at [redacted]w[redacted]d by  03/01/2021, by Ultrasound presenting for routine prenatal visit  Plan   pregnancy 7 Problems (from 07/27/20 to present)     Problem Noted Resolved   Supervision of other normal pregnancy, antepartum 07/27/2020 by Conard Novak, MD No        Preterm labor symptoms and general obstetric precautions including but not limited to vaginal bleeding, contractions, leaking of fluid and fetal movement were reviewed in detail with the patient. Please refer to After Visit Summary for other counseling recommendations.   NOB labs today -anatomy scan already scheduled  Return in about 8 weeks (around 11/11/2020) for ROB (Keep previously scheduled appts).   Thomasene Mohair, MD, [facility], [facility] 09/16/2020 10:49 AM

## 2020-09-17 LAB — RPR+RH+ABO+RUB AB+AB SCR+CB...
Antibody Screen: NEGATIVE
HIV Screen 4th Generation wRfx: NONREACTIVE
Hematocrit: 32.5 % — ABNORMAL LOW (ref 34.0–46.6)
Hemoglobin: 11.2 g/dL (ref 11.1–15.9)
Hepatitis B Surface Ag: NEGATIVE
MCH: 31.6 pg (ref 26.6–33.0)
MCHC: 34.5 g/dL (ref 31.5–35.7)
MCV: 92 fL (ref 79–97)
Platelets: 217 10*3/uL (ref 150–450)
RBC: 3.54 x10E6/uL — ABNORMAL LOW (ref 3.77–5.28)
RDW: 11.8 % (ref 11.7–15.4)
RPR Ser Ql: NONREACTIVE
Rh Factor: POSITIVE
Rubella Antibodies, IGG: 1.19 index (ref >0.99)
Varicella zoster IgG: 212 index (ref >165)
WBC: 5.6 10*3/uL (ref 3.4–10.8)

## 2020-10-06 ENCOUNTER — Other Ambulatory Visit: Payer: Self-pay

## 2020-10-06 ENCOUNTER — Encounter: Payer: Self-pay | Admitting: Obstetrics and Gynecology

## 2020-10-06 ENCOUNTER — Ambulatory Visit (INDEPENDENT_AMBULATORY_CARE_PROVIDER_SITE_OTHER): Payer: Medicaid Other | Admitting: Obstetrics and Gynecology

## 2020-10-06 VITALS — BP 120/76 | Wt 135.0 lb

## 2020-10-06 DIAGNOSIS — Z3A19 19 weeks gestation of pregnancy: Secondary | ICD-10-CM

## 2020-10-06 DIAGNOSIS — O219 Vomiting of pregnancy, unspecified: Secondary | ICD-10-CM

## 2020-10-06 DIAGNOSIS — Z3480 Encounter for supervision of other normal pregnancy, unspecified trimester: Secondary | ICD-10-CM

## 2020-10-06 NOTE — Progress Notes (Signed)
  Routine Prenatal Care Visit  Subjective  Sherry Reynolds is a 30 y.o. [id number] at [redacted]w[redacted]d being seen today for ongoing prenatal care.  She is currently monitored for the following issues for this low-risk pregnancy and has ADD (attention deficit disorder); Anxiety and depression; Bilateral bunions; Family history of ovarian cancer; Tobacco use; Other specified behavioral and emotional disorders with onset usually occurring in childhood and adolescence; History of recurrent miscarriages; Nausea and vomiting during pregnancy; Palpitations; and Supervision of other normal pregnancy, antepartum on their problem list.  ----------------------------------------------------------------------------------- Patient reports vaginal spotting yesterday afternoon and into the evening, a small amount this morning, none now. She states that the spotting was both darker colored and brighter red, not heavy. She denies cramping. Last sexual intercourse was nearly a week ago.  She has not yet felt fetal movement. She denies urinary symptoms of any type. She denies abnormal vaginal discharge (apart from the bleeding), vaginal irritation, itching and burning.  She denies GI symptoms.   Contractions: Not present. Vag. Bleeding: Scant.  Movement: Absent. Leaking Fluid denies.  ----------------------------------------------------------------------------------- The following portions of the patient's history were reviewed and updated as appropriate: allergies, current medications, past family history, past medical history, past social history, past surgical history and problem list. Problem list updated.  Objective  Blood pressure 120/76, weight 135 lb (61.2 kg), last menstrual period 05/12/2020, currently breastfeeding. Pregravid weight 126 lb (57.2 kg) Total Weight Gain 9 lb (4.082 kg) Urinalysis: Urine Protein    Urine Glucose    Fetal Status: Fetal Heart Rate (bpm): 146   Movement: Absent  Presentation: Vertex  General:   Alert, oriented and cooperative. Patient is in no acute distress.  Skin: Skin is warm and dry. No rash noted.   Cardiovascular: Normal heart rate noted  Respiratory: Normal respiratory effort, no problems with respiration noted  Abdomen: Soft, gravid, appropriate for gestational age. Pain/Pressure: Absent     Pelvic:  Cervical exam performed Dilation: Closed Effacement (%): 30 Station: Ballotable, NEFG, normal BUS, vagina normally rugated without lesions. Cervix appears closed visually with no bleeding. No blood at all noted in the vaginal vault.   Extremities: Normal range of motion.     Mental Status: Normal mood and affect. Normal behavior. Normal judgment and thought content.   Female chaperone present for pelvic exam  Bedside u/s performed.  Fetus in vertex presentation, FHR noted to be normal (as above). Amniotic fluid subjectively normal. Cervix measured around 3.4 cm.  No evidence of abruption.  Placenta is anterior and wraps around a little to the posterior aspect.  No obvious evidence of previa.   Assessment   29 y.o. [id number] at [redacted]w[redacted]d by  03/01/2021, by Ultrasound presenting for routine prenatal visit  Plan   pregnancy 7 Problems (from 07/27/20 to present)     Problem Noted Resolved   Supervision of other normal pregnancy, antepartum 07/27/2020 by Conard Novak, MD No        Preterm labor symptoms and general obstetric precautions including but not limited to vaginal bleeding, contractions, leaking of fluid and fetal movement were reviewed in detail with the patient. Please refer to After Visit Summary for other counseling recommendations.   - patient reassured. Precautions given.   Return for Keep previously scheduled visits.   Thomasene Mohair, MD, [facility], [facility] 10/06/2020 4:58 PM

## 2020-10-14 ENCOUNTER — Ambulatory Visit: Payer: Medicaid Other

## 2020-10-17 ENCOUNTER — Other Ambulatory Visit: Payer: Self-pay

## 2020-10-17 ENCOUNTER — Ambulatory Visit
Admission: RE | Admit: 2020-10-17 | Discharge: 2020-10-17 | Disposition: A | Discharge status: Home / Self Care | Payer: Medicaid Other | Source: Ambulatory Visit | Attending: Obstetrics and Gynecology | Admitting: Obstetrics and Gynecology

## 2020-10-17 DIAGNOSIS — Z3481 Encounter for supervision of other normal pregnancy, first trimester: Secondary | ICD-10-CM | POA: Diagnosis not present

## 2020-10-17 DIAGNOSIS — Z362 Encounter for other antenatal screening follow-up: Secondary | ICD-10-CM | POA: Diagnosis not present

## 2020-10-17 DIAGNOSIS — Z3A20 20 weeks gestation of pregnancy: Secondary | ICD-10-CM | POA: Diagnosis not present

## 2020-10-18 ENCOUNTER — Encounter: Payer: Self-pay | Admitting: Obstetrics and Gynecology

## 2020-10-18 ENCOUNTER — Ambulatory Visit (INDEPENDENT_AMBULATORY_CARE_PROVIDER_SITE_OTHER): Payer: Medicaid Other | Admitting: Obstetrics and Gynecology

## 2020-10-18 VITALS — BP 120/70 | Wt 137.0 lb

## 2020-10-18 DIAGNOSIS — Z3480 Encounter for supervision of other normal pregnancy, unspecified trimester: Secondary | ICD-10-CM

## 2020-10-18 DIAGNOSIS — Z3A20 20 weeks gestation of pregnancy: Secondary | ICD-10-CM

## 2020-10-18 NOTE — Progress Notes (Signed)
  Routine Prenatal Care Visit  Subjective  Sherry Reynolds is a 30 y.o. [phone] at [redacted]w[redacted]d being seen today for ongoing prenatal care.  She is currently monitored for the following issues for this low-risk pregnancy and has ADD (attention deficit disorder); Anxiety and depression; Bilateral bunions; Family history of ovarian cancer; Tobacco use; Other specified behavioral and emotional disorders with onset usually occurring in childhood and adolescence; History of recurrent miscarriages; Nausea and vomiting during pregnancy; Palpitations; and Supervision of other normal pregnancy, antepartum on their problem list.  ----------------------------------------------------------------------------------- Patient reports no complaints.   Contractions: Not present. Vag. Bleeding: None.  Movement: Present. Leaking Fluid denies.  ----------------------------------------------------------------------------------- The following portions of the patient's history were reviewed and updated as appropriate: allergies, current medications, past family history, past medical history, past social history, past surgical history and problem list. Problem list updated.  Objective  Blood pressure 120/70, weight 137 lb (62.1 kg), last menstrual period 05/12/2020, currently breastfeeding. Pregravid weight 126 lb (57.2 kg) Total Weight Gain 11 lb (4.99 kg) Urinalysis: Urine Protein    Urine Glucose    Fetal Status: Fetal Heart Rate (bpm): 145   Movement: Present     General:  Alert, oriented and cooperative. Patient is in no acute distress.  Skin: Skin is warm and dry. No rash noted.   Cardiovascular: Normal heart rate noted  Respiratory: Normal respiratory effort, no problems with respiration noted  Abdomen: Soft, gravid, appropriate for gestational age. Pain/Pressure: Present     Pelvic:  Cervical exam deferred        Extremities: Normal range of motion.     Mental Status: Normal mood and affect. Normal behavior. Normal  judgment and thought content.   Assessment   30 y.o. F7T0240 at 108w6d by  03/01/2021, by Ultrasound presenting for routine prenatal visit  Plan   pregnancy 7 Problems (from 07/27/20 to present)     Problem Noted Resolved   Supervision of other normal pregnancy, antepartum 07/27/2020 by Conard Novak, MD No        Preterm labor symptoms and general obstetric precautions including but not limited to vaginal bleeding, contractions, leaking of fluid and fetal movement were reviewed in detail with the patient. Please refer to After Visit Summary for other counseling recommendations.   - anatomy u/s from yesterday has not been read yet. Reynolds follow up on results.   Return for keep next appt.   Thomasene Mohair, MD, [facility], [facility] 10/18/2020 10:41 AM

## 2020-11-11 ENCOUNTER — Other Ambulatory Visit: Payer: Self-pay

## 2020-11-11 ENCOUNTER — Encounter: Payer: Self-pay | Admitting: Obstetrics and Gynecology

## 2020-11-11 ENCOUNTER — Ambulatory Visit (INDEPENDENT_AMBULATORY_CARE_PROVIDER_SITE_OTHER): Payer: Medicaid Other | Admitting: Obstetrics and Gynecology

## 2020-11-11 VITALS — BP 124/70 | Wt 141.0 lb

## 2020-11-11 DIAGNOSIS — O219 Vomiting of pregnancy, unspecified: Secondary | ICD-10-CM

## 2020-11-11 DIAGNOSIS — Z3A24 24 weeks gestation of pregnancy: Secondary | ICD-10-CM

## 2020-11-11 DIAGNOSIS — Z113 Encounter for screening for infections with a predominantly sexual mode of transmission: Secondary | ICD-10-CM

## 2020-11-11 DIAGNOSIS — Z131 Encounter for screening for diabetes mellitus: Secondary | ICD-10-CM

## 2020-11-11 DIAGNOSIS — Z3482 Encounter for supervision of other normal pregnancy, second trimester: Secondary | ICD-10-CM

## 2020-11-11 MED ORDER — ONDANSETRON 4 MG PO TBDP: 4.0000 mg | ORAL_TABLET | Freq: Three times a day (TID) | ORAL | 2 refills | Status: DC | PRN

## 2020-11-11 NOTE — Addendum Note (Signed)
Addended by: Thomasene Mohair D on: 11/11/2020 12:28 PM   Modules accepted: Orders

## 2020-11-11 NOTE — Progress Notes (Signed)
Routine Prenatal Care Visit  Subjective  Sherry Reynolds is a 30 y.o. [phone] at [redacted]w[redacted]d being seen today for ongoing prenatal care.  She is currently monitored for the following issues for this low-risk pregnancy and has ADD (attention deficit disorder); Anxiety and depression; Bilateral bunions; Family history of ovarian cancer; Tobacco use; Other specified behavioral and emotional disorders with onset usually occurring in childhood and adolescence; History of recurrent miscarriages; Nausea and vomiting during pregnancy; Palpitations; and Supervision of other normal pregnancy, antepartum on their problem list.  ----------------------------------------------------------------------------------- Patient reports no complaints.   Contractions: Not present. Vag. Bleeding: None.  Movement: Present. Leaking Fluid denies.  ----------------------------------------------------------------------------------- The following portions of the patient's history were reviewed and updated as appropriate: allergies, current medications, past family history, past medical history, past social history, past surgical history and problem list. Problem list updated.  Objective  Blood pressure 124/70, weight 141 lb (64 kg), last menstrual period 05/12/2020, currently breastfeeding. Pregravid weight 126 lb (57.2 kg) Total Weight Gain 15 lb (6.804 kg) Urinalysis: Urine Protein    Urine Glucose    Fetal Status: Fetal Heart Rate (bpm): 145 Fundal Height: 24 cm Movement: Present     General:  Alert, oriented and cooperative. Patient is in no acute distress.  Skin: Skin is warm and dry. No rash noted.   Cardiovascular: Normal heart rate noted  Respiratory: Normal respiratory effort, no problems with respiration noted  Abdomen: Soft, gravid, appropriate for gestational age. Pain/Pressure: Absent     Pelvic:  Cervical exam deferred        Extremities: Normal range of motion.     Mental Status: Normal mood and affect. Normal  behavior. Normal judgment and thought content.   Assessment   30 y.o. [id number] at [redacted]w[redacted]d by  03/01/2021, by Ultrasound presenting for routine prenatal visit  Plan   pregnancy 7 Problems (from 07/27/20 to present)     Problem Noted Resolved   Supervision of other normal pregnancy, antepartum 07/27/2020 by Conard Novak, MD No   Overview Signed 11/11/2020 10:29 AM by Conard Novak, MD     Nursing Staff Provider  Office Location  [facility] Dating  9 wk u/s  Language  English Anatomy US  complete  Flu Vaccine   Genetic Screen  NIPS: diploid XX  TDaP vaccine    Hgb A1C or  GTT Early : n/a Third trimester :   Covid    LAB RESULTS   Rhogam   Blood Type B/Positive/-- (09/09 1059)   Feeding Plan  Antibody Negative (09/09 1059)  Contraception  Rubella 1.19 (09/09 1059)  Circumcision  RPR Non Reactive (09/09 1059)   Pediatrician   HBsAg Negative (09/09 1059)   Support Person  HIV Non Reactive (09/09 1059)  Prenatal Classes  Varicella immune    GBS  (For PCN allergy, check sensitivities)   BTL Consent     VBAC Consent  Pap  03/2020    Hgb Electro    Pelvis Tested  CF      SMA                    Preterm labor symptoms and general obstetric precautions including but not limited to vaginal bleeding, contractions, leaking of fluid and fetal movement were reviewed in detail with the patient. Please refer to After Visit Summary for other counseling recommendations.   Return in about 4 weeks (around 12/09/2020) for 28 wk labs with routine prenatal w SDJ if possible.   Thomasene Mohair, MD,  [facility]  [facility] 11/11/2020 10:40 AM

## 2020-12-06 ENCOUNTER — Other Ambulatory Visit: Payer: Medicaid Other

## 2020-12-06 ENCOUNTER — Encounter: Payer: Self-pay | Admitting: Obstetrics and Gynecology

## 2020-12-06 ENCOUNTER — Other Ambulatory Visit: Payer: Self-pay

## 2020-12-06 ENCOUNTER — Ambulatory Visit (INDEPENDENT_AMBULATORY_CARE_PROVIDER_SITE_OTHER): Payer: Medicaid Other | Admitting: Obstetrics and Gynecology

## 2020-12-06 VITALS — BP 122/70 | Wt 146.0 lb

## 2020-12-06 DIAGNOSIS — Z3A27 27 weeks gestation of pregnancy: Secondary | ICD-10-CM | POA: Diagnosis not present

## 2020-12-06 DIAGNOSIS — F419 Anxiety disorder, unspecified: Secondary | ICD-10-CM

## 2020-12-06 DIAGNOSIS — Z113 Encounter for screening for infections with a predominantly sexual mode of transmission: Secondary | ICD-10-CM | POA: Diagnosis not present

## 2020-12-06 DIAGNOSIS — Z3483 Encounter for supervision of other normal pregnancy, third trimester: Secondary | ICD-10-CM

## 2020-12-06 DIAGNOSIS — Z131 Encounter for screening for diabetes mellitus: Secondary | ICD-10-CM | POA: Diagnosis not present

## 2020-12-06 DIAGNOSIS — F32A Depression, unspecified: Secondary | ICD-10-CM

## 2020-12-06 NOTE — Progress Notes (Signed)
Routine Prenatal Care Visit  Subjective  Sherry Reynolds is a 30 y.o. [phone] at [redacted]w[redacted]d being seen today for ongoing prenatal care.  She is currently monitored for the following issues for this low-risk pregnancy and has ADD (attention deficit disorder); Anxiety and depression; Bilateral bunions; Family history of ovarian cancer; Tobacco use; Other specified behavioral and emotional disorders with onset usually occurring in childhood and adolescence; History of recurrent miscarriages; Nausea and vomiting during pregnancy; Palpitations; and Supervision of other normal pregnancy, antepartum on their problem list.  ----------------------------------------------------------------------------------- Patient reports no complaints.   Contractions: Not present. Vag. Bleeding: None.  Movement: Present. Leaking Fluid denies.  ----------------------------------------------------------------------------------- The following portions of the patient's history were reviewed and updated as appropriate: allergies, current medications, past family history, past medical history, past social history, past surgical history and problem list. Problem list updated.  Objective  Blood pressure 122/70, weight 146 lb (66.2 kg), last menstrual period 05/12/2020, currently breastfeeding. Pregravid weight 126 lb (57.2 kg) Total Weight Gain 20 lb (9.072 kg) Urinalysis: Urine Protein    Urine Glucose    Fetal Status: Fetal Heart Rate (bpm): 145 Fundal Height: 27 cm Movement: Present     General:  Alert, oriented and cooperative. Patient is in no acute distress.  Skin: Skin is warm and dry. No rash noted.   Cardiovascular: Normal heart rate noted  Respiratory: Normal respiratory effort, no problems with respiration noted  Abdomen: Soft, gravid, appropriate for gestational age. Pain/Pressure: Absent     Pelvic:  Cervical exam deferred        Extremities: Normal range of motion.     Mental Status: Normal mood and affect. Normal  behavior. Normal judgment and thought content.   Assessment   30 y.o. [id number] at [redacted]w[redacted]d by  03/01/2021, by Ultrasound presenting for routine prenatal visit  Plan   pregnancy 7 Problems (from 07/27/20 to present)     Problem Noted Resolved   Supervision of other normal pregnancy, antepartum 07/27/2020 by Conard Novak, MD No   Overview Signed 11/11/2020 10:29 AM by Conard Novak, MD     Nursing Staff Provider  Office Location  [facility] Dating  9 wk u/s  Language  English Anatomy US  complete  Flu Vaccine   Genetic Screen  NIPS: diploid XX  TDaP vaccine    Hgb A1C or  GTT Early : n/a Third trimester :   Covid    LAB RESULTS   Rhogam   Blood Type B/Positive/-- (09/09 1059)   Feeding Plan  Antibody Negative (09/09 1059)  Contraception  Rubella 1.19 (09/09 1059)  Circumcision  RPR Non Reactive (09/09 1059)   Pediatrician   HBsAg Negative (09/09 1059)   Support Person  HIV Non Reactive (09/09 1059)  Prenatal Classes  Varicella immune    GBS  (For PCN allergy, check sensitivities)   BTL Consent     VBAC Consent  Pap  03/2020    Hgb Electro    Pelvis Tested  CF      SMA                    Preterm labor symptoms and general obstetric precautions including but not limited to vaginal bleeding, contractions, leaking of fluid and fetal movement were reviewed in detail with the patient. Please refer to After Visit Summary for other counseling recommendations.   Return in about 2 weeks (around 12/20/2020) for ROB.   Thomasene Mohair, MD, [facility], [facility] 12/06/2020 10:03  AM

## 2020-12-06 NOTE — Addendum Note (Signed)
Addended by: Thomasene Mohair D on: 12/06/2020 10:29 AM   Modules accepted: Orders

## 2020-12-07 LAB — 28 WEEK RH+PANEL
Basophils Absolute: 0 10*3/uL (ref 0.0–0.2)
Basos: 0 %
EOS (ABSOLUTE): 0.1 10*3/uL (ref 0.0–0.4)
Eos: 1 %
Gestational Diabetes Screen: 112 mg/dL (ref 70–139)
HIV Screen 4th Generation wRfx: NONREACTIVE
Hematocrit: 32.2 % — ABNORMAL LOW (ref 34.0–46.6)
Hemoglobin: 10.7 g/dL — ABNORMAL LOW (ref 11.1–15.9)
Immature Grans (Abs): 0 10*3/uL (ref 0.0–0.1)
Immature Granulocytes: 0 %
Lymphocytes Absolute: 1 10*3/uL (ref 0.7–3.1)
Lymphs: 21 %
MCH: 31.2 pg (ref 26.6–33.0)
MCHC: 33.2 g/dL (ref 31.5–35.7)
MCV: 94 fL (ref 79–97)
Monocytes Absolute: 0.3 10*3/uL (ref 0.1–0.9)
Monocytes: 6 %
Neutrophils Absolute: 3.6 10*3/uL (ref 1.4–7.0)
Neutrophils: 72 %
Platelets: 161 10*3/uL (ref 150–450)
RBC: 3.43 x10E6/uL — ABNORMAL LOW (ref 3.77–5.28)
RDW: 11.8 % (ref 11.7–15.4)
RPR Ser Ql: NONREACTIVE
WBC: 5 10*3/uL (ref 3.4–10.8)

## 2020-12-23 ENCOUNTER — Encounter: Payer: Medicaid Other | Admitting: Obstetrics and Gynecology

## 2020-12-27 ENCOUNTER — Encounter: Payer: Medicaid Other | Admitting: Obstetrics and Gynecology

## 2020-12-27 ENCOUNTER — Telehealth: Payer: Self-pay

## 2020-12-27 NOTE — Telephone Encounter (Signed)
Pt calling; has a cold; 31wks; what can she take?  [phone]  Adv for cough - plain robitussin/plain musinex, Hall's cough drops; sore throat - gargle with warm salt water; sinuses - plain sudafed; aches, pain and fever - e.s. tylenol two q6h while awake; sip hot beverages/soup; push fluids and rest.  Has not tested for covid b/c her son has been sick before her and tested negative for covid, flu, and RSV.  Adv it doesn't seem to be getting better to see her primary as they can test for these and we do not.

## 2021-01-03 ENCOUNTER — Ambulatory Visit (INDEPENDENT_AMBULATORY_CARE_PROVIDER_SITE_OTHER): Payer: Medicaid Other | Admitting: Licensed Practical Nurse

## 2021-01-03 ENCOUNTER — Other Ambulatory Visit: Payer: Self-pay

## 2021-01-03 VITALS — BP 118/70 | Ht 62.0 in | Wt 154.8 lb

## 2021-01-03 DIAGNOSIS — Z23 Encounter for immunization: Secondary | ICD-10-CM | POA: Diagnosis not present

## 2021-01-03 DIAGNOSIS — Z3480 Encounter for supervision of other normal pregnancy, unspecified trimester: Secondary | ICD-10-CM

## 2021-01-03 DIAGNOSIS — Z3A31 31 weeks gestation of pregnancy: Secondary | ICD-10-CM

## 2021-01-03 NOTE — Progress Notes (Signed)
Routine Prenatal Care Visit  Subjective  Sherry Reynolds is a 30 y.o. [id number] at [redacted]w[redacted]d being seen today for ongoing prenatal care.  She is currently monitored for the following issues for this low-risk pregnancy and has ADD (attention deficit disorder); Anxiety and depression; Bilateral bunions; Family history of ovarian cancer; Tobacco use; Other specified behavioral and emotional disorders with onset usually occurring in childhood and adolescence; History of recurrent miscarriages; Nausea and vomiting during pregnancy; Palpitations; and Supervision of other normal pregnancy, antepartum on their problem list.  ----------------------------------------------------------------------------------- Patient reports Doing ok, not sleeping well d/t normal discomforts of pregnancy-but feels like she is able to rest.  Reports mood is good. Undecided about whether she desires more children, also does not plan to use birth control. Had a good breastfeeding experience with both children at home, desires to BF again.   Contractions: Not present. Vag. Bleeding: None.  Movement: Present. Leaking Fluid denies.  ----------------------------------------------------------------------------------- The following portions of the patient's history were reviewed and updated as appropriate: allergies, current medications, past family history, past medical history, past social history, past surgical history and problem list. Problem list updated.  Objective  Blood pressure 118/70, height 5\' 2"  (1.575 m), weight 154 lb 12.8 oz (70.2 kg), last menstrual period 05/12/2020, currently breastfeeding. Pregravid weight 126 lb (57.2 kg) Total Weight Gain 28 lb 12.8 oz (13.1 kg) Urinalysis: Urine Protein    Urine Glucose    Fetal Status:     Movement: Present     General:  Alert, oriented and cooperative. Patient is in no acute distress.  Skin: Skin is warm and dry. No rash noted.   Cardiovascular: Normal heart rate noted   Respiratory: Normal respiratory effort, no problems with respiration noted  Abdomen: Soft, gravid, appropriate for gestational age. Pain/Pressure: Present     Pelvic:  Cervical exam deferred        Extremities: Normal range of motion.     Mental Status: Normal mood and affect. Normal behavior. Normal judgment and thought content.   Assessment   30 y.o. 07/12/2020 at [redacted]w[redacted]d by  03/01/2021, by Ultrasound presenting for routine prenatal visit TDAP  Plan   pregnancy 7 Problems (from 07/27/20 to present)     Problem Noted Resolved   Supervision of other normal pregnancy, antepartum 07/27/2020 by 07/29/2020, MD No   Overview Addendum 01/03/2021  2:35 PM by 01/05/2021, CNM     Nursing Staff Provider  Office Location  [facility] Dating  9 wk u/s  Language  English Anatomy Ellwood Sayers  complete  Flu Vaccine   Genetic Screen  NIPS: diploid XX  TDaP vaccine  01/03/21 Hgb A1C or  GTT Early : n/a Third trimester :   Covid    LAB RESULTS   Rhogam   Blood Type B/Positive/-- (09/09 1059)   Feeding Plan breast Antibody Negative (09/09 1059)  Contraception none Rubella 1.19 (09/09 1059)  Circumcision NA RPR Non Reactive (09/09 1059)   Pediatrician   HBsAg Negative (09/09 1059)   Support Person Husband Josh, sister 10-26-2001  HIV Non Reactive (09/09 1059)  Prenatal Classes  Varicella immune    GBS  (For PCN allergy, check sensitivities)   BTL Consent NA    VBAC Consent NA Pap  03/2020    Hgb Electro    Pelvis Tested  CF      SMA                     Preterm labor symptoms and  general obstetric precautions including but not limited to vaginal bleeding, contractions, leaking of fluid and fetal movement were reviewed in detail with the patient. Please refer to After Visit Summary for other counseling recommendations.   TDAP today  RTC in 2 weeks  Carie Caddy, CNM  [facility], [facility]  01/03/21  2:37 PM

## 2021-01-08 NOTE — L&D Delivery Note (Signed)
Delivery Note At 6:48 PM a viable female was delivered via Vaginal, Spontaneous (Presentation: Right Occiput Anterior).  APGAR: 8, 9; weight pending.   Placenta status: Spontaneous, Intact.  Cord: 3 vessels with the following complications: None.  Cord pH: n/a  Anesthesia: Epidural Episiotomy: None Lacerations: None Suture Repair: n/a Est. Blood Loss (mL): 300  Mom to postpartum.  Baby to Couplet care / Skin to Skin.  Called to see patient.  Mom pushed to deliver a viable female infant.  The head followed by shoulders, which delivered without difficulty, and the rest of the body.  A single nuchal cord noted and reduced after delivery of baby.  Baby to mom's chest.  Cord clamped and cut after > 1 min delay.  No cord blood obtained.  Placenta delivered spontaneously, intact, with a 3-vessel cord.  No vaginal, cervical, or perineal lacerations. All counts correct.  Hemostasis obtained with IV pitocin and fundal massage. EBL 300 mL.     Prentice Docker, MD 03/01/2021, 7:00 PM

## 2021-01-17 ENCOUNTER — Encounter: Payer: Self-pay | Admitting: Obstetrics & Gynecology

## 2021-01-17 ENCOUNTER — Other Ambulatory Visit: Payer: Self-pay

## 2021-01-17 ENCOUNTER — Ambulatory Visit (INDEPENDENT_AMBULATORY_CARE_PROVIDER_SITE_OTHER): Payer: Medicaid Other | Admitting: Obstetrics & Gynecology

## 2021-01-17 VITALS — BP 100/60 | Wt 154.0 lb

## 2021-01-17 DIAGNOSIS — Z3A33 33 weeks gestation of pregnancy: Secondary | ICD-10-CM | POA: Diagnosis not present

## 2021-01-17 DIAGNOSIS — Z3483 Encounter for supervision of other normal pregnancy, third trimester: Secondary | ICD-10-CM | POA: Diagnosis not present

## 2021-01-17 DIAGNOSIS — O219 Vomiting of pregnancy, unspecified: Secondary | ICD-10-CM

## 2021-01-17 MED ORDER — ONDANSETRON 4 MG PO TBDP: 4.0000 mg | ORAL_TABLET | Freq: Three times a day (TID) | ORAL | 2 refills | Status: AC | PRN

## 2021-01-17 NOTE — Patient Instructions (Signed)

## 2021-01-17 NOTE — Progress Notes (Signed)
°  Subjective  Fetal Movement? yes Contractions? no Leaking Fluid? no Vaginal Bleeding? no Nausea still daily Objective  BP 100/60    Wt 154 lb (69.9 kg)    LMP 05/12/2020    BMI 28.17 kg/m  General: NAD Pumonary: no increased work of breathing Abdomen: gravid, non-tender Extremities: no edema Psychiatric: mood appropriate, affect full  Assessment  31 y.o. [id number] at [redacted]w[redacted]d by  03/01/2021, by Ultrasound presenting for routine prenatal visit  Plan   Problem List Items Addressed This Visit      Digestive   Nausea and vomiting during pregnancy   Relevant Medications   ondansetron (ZOFRAN ODT) 4 MG disintegrating tablet     Other   Supervision of other normal pregnancy, antepartum - Primary  Other Visit Diagnoses    [redacted] weeks gestation of pregnancy        PNV, FMC, PTL precautions discussed  pregnancy 7 Problems (from 07/27/20 to present)    Problem Noted Resolved   Supervision of other normal pregnancy, antepartum 07/27/2020 by Conard Novak, MD No   Overview Addendum 01/03/2021  2:35 PM by Ellwood Sayers, CNM     Nursing Staff Provider  Office Location  [facility] Dating  9 wk u/s  Language  English Anatomy US  complete  Flu Vaccine   Genetic Screen  NIPS: diploid XX  TDaP vaccine  01/03/21 Hgb A1C or  GTT Early : n/a Third trimester :   Covid    LAB RESULTS   Rhogam  n/a Blood Type B/Positive/-- (09/09 1059)   Feeding Plan breast Antibody Negative (09/09 1059)  Contraception none Rubella 1.19 (09/09 1059)  Circumcision NA RPR Non Reactive (09/09 1059)   Pediatrician   HBsAg Negative (09/09 1059)   Support Person Husband Josh, sister Joni Reining  HIV Non Reactive (09/09 1059)  Prenatal Classes  Varicella immune    GBS  (For PCN allergy, check sensitivities)   BTL Consent NA    VBAC Consent NA Pap  03/2020    Hgb Electro    Pelvis Tested yes                           Annamarie Major, MD, [facility], [facility] 01/17/2021  10:52  AM\

## 2021-01-31 ENCOUNTER — Encounter: Payer: Medicaid Other | Admitting: Advanced Practice Midwife

## 2021-02-07 ENCOUNTER — Encounter: Payer: Medicaid Other | Admitting: Licensed Practical Nurse

## 2021-02-08 DIAGNOSIS — Z419 Encounter for procedure for purposes other than remedying health state, unspecified: Secondary | ICD-10-CM | POA: Diagnosis not present

## 2021-02-09 DIAGNOSIS — Z3483 Encounter for supervision of other normal pregnancy, third trimester: Secondary | ICD-10-CM | POA: Diagnosis not present

## 2021-02-09 DIAGNOSIS — Z3A37 37 weeks gestation of pregnancy: Secondary | ICD-10-CM | POA: Diagnosis not present

## 2021-02-14 ENCOUNTER — Encounter: Payer: Medicaid Other | Admitting: Obstetrics

## 2021-02-17 DIAGNOSIS — F419 Anxiety disorder, unspecified: Secondary | ICD-10-CM | POA: Diagnosis not present

## 2021-02-17 DIAGNOSIS — Z3483 Encounter for supervision of other normal pregnancy, third trimester: Secondary | ICD-10-CM | POA: Diagnosis not present

## 2021-02-17 DIAGNOSIS — Z3A38 38 weeks gestation of pregnancy: Secondary | ICD-10-CM | POA: Diagnosis not present

## 2021-02-17 DIAGNOSIS — F32A Depression, unspecified: Secondary | ICD-10-CM | POA: Diagnosis not present

## 2021-02-21 ENCOUNTER — Encounter: Payer: Medicaid Other | Admitting: Licensed Practical Nurse

## 2021-02-22 DIAGNOSIS — Z3483 Encounter for supervision of other normal pregnancy, third trimester: Secondary | ICD-10-CM | POA: Diagnosis not present

## 2021-02-22 DIAGNOSIS — Z3A39 39 weeks gestation of pregnancy: Secondary | ICD-10-CM | POA: Diagnosis not present

## 2021-02-27 ENCOUNTER — Other Ambulatory Visit: Payer: Self-pay

## 2021-02-27 ENCOUNTER — Other Ambulatory Visit
Admission: RE | Admit: 2021-02-27 | Discharge: 2021-02-27 | Disposition: A | Discharge status: Home / Self Care | Payer: Medicaid Other | Source: Ambulatory Visit | Attending: Obstetrics and Gynecology | Admitting: Obstetrics and Gynecology

## 2021-02-27 DIAGNOSIS — Z20822 Contact with and (suspected) exposure to covid-19: Secondary | ICD-10-CM | POA: Diagnosis not present

## 2021-02-27 DIAGNOSIS — Z01812 Encounter for preprocedural laboratory examination: Secondary | ICD-10-CM | POA: Insufficient documentation

## 2021-02-27 DIAGNOSIS — Z3480 Encounter for supervision of other normal pregnancy, unspecified trimester: Secondary | ICD-10-CM

## 2021-02-28 LAB — SARS CORONAVIRUS 2 (TAT 6-24 HRS): SARS Coronavirus 2: NEGATIVE

## 2021-03-01 ENCOUNTER — Inpatient Hospital Stay
Admission: EM | Admit: 2021-03-01 | Discharge: 2021-03-02 | DRG: 807 | Disposition: A | Discharge status: Home / Self Care | Payer: Medicaid Other | Attending: Obstetrics | Admitting: Obstetrics

## 2021-03-01 ENCOUNTER — Inpatient Hospital Stay: Payer: Medicaid Other | Admitting: Anesthesiology

## 2021-03-01 ENCOUNTER — Encounter: Payer: Self-pay | Admitting: Obstetrics and Gynecology

## 2021-03-01 ENCOUNTER — Other Ambulatory Visit: Payer: Self-pay | Admitting: Obstetrics and Gynecology

## 2021-03-01 ENCOUNTER — Other Ambulatory Visit: Payer: Self-pay

## 2021-03-01 DIAGNOSIS — Z87891 Personal history of nicotine dependence: Secondary | ICD-10-CM

## 2021-03-01 DIAGNOSIS — O2623 Pregnancy care for patient with recurrent pregnancy loss, third trimester: Principal | ICD-10-CM | POA: Diagnosis present

## 2021-03-01 DIAGNOSIS — Z3490 Encounter for supervision of normal pregnancy, unspecified, unspecified trimester: Secondary | ICD-10-CM | POA: Diagnosis present

## 2021-03-01 DIAGNOSIS — Z3493 Encounter for supervision of normal pregnancy, unspecified, third trimester: Secondary | ICD-10-CM | POA: Diagnosis not present

## 2021-03-01 DIAGNOSIS — Z3480 Encounter for supervision of other normal pregnancy, unspecified trimester: Secondary | ICD-10-CM

## 2021-03-01 DIAGNOSIS — Z3A40 40 weeks gestation of pregnancy: Secondary | ICD-10-CM | POA: Diagnosis not present

## 2021-03-01 LAB — CBC
HCT: 30.7 % — ABNORMAL LOW (ref 36.0–46.0)
Hemoglobin: 10.1 g/dL — ABNORMAL LOW (ref 12.0–15.0)
MCH: 29.3 pg (ref 26.0–34.0)
MCHC: 32.9 g/dL (ref 30.0–36.0)
MCV: 89 fL (ref 80.0–100.0)
Platelets: 172 10*3/uL (ref 150–400)
RBC: 3.45 MIL/uL — ABNORMAL LOW (ref 3.87–5.11)
RDW: 12 % (ref 11.5–15.5)
WBC: 7.1 10*3/uL (ref 4.0–10.5)
nRBC: 0 % (ref 0.0–0.2)

## 2021-03-01 LAB — TYPE AND SCREEN
ABO/RH(D): B POS
Antibody Screen: NEGATIVE

## 2021-03-01 MED ORDER — EPHEDRINE 5 MG/ML INJ
10.0000 mg | INTRAVENOUS | Status: DC | PRN
  Filled 2021-03-01: qty 2

## 2021-03-01 MED ORDER — ACETAMINOPHEN 325 MG PO TABS
650.0000 mg | ORAL_TABLET | ORAL | Status: DC | PRN
  Administered 2021-03-02: 650 mg via ORAL
  Filled 2021-03-01 (×2): qty 2

## 2021-03-01 MED ORDER — ONDANSETRON HCL 4 MG/2ML IJ SOLN
4.0000 mg | Freq: Four times a day (QID) | INTRAMUSCULAR | Status: DC | PRN
  Administered 2021-03-01: 4 mg via INTRAVENOUS
  Filled 2021-03-01: qty 2

## 2021-03-01 MED ORDER — BISACODYL 10 MG RE SUPP: 10.0000 mg | Freq: Every day | RECTAL | Status: DC | PRN

## 2021-03-01 MED ORDER — BENZOCAINE-MENTHOL 20-0.5 % EX AERO
1.0000 "application " | INHALATION_SPRAY | CUTANEOUS | Status: DC | PRN
  Administered 2021-03-01: 1 via TOPICAL
  Filled 2021-03-01: qty 56

## 2021-03-01 MED ORDER — SOD CITRATE-CITRIC ACID 500-334 MG/5ML PO SOLN: 30.0000 mL | ORAL | Status: DC | PRN

## 2021-03-01 MED ORDER — FENTANYL CITRATE (PF) 100 MCG/2ML IJ SOLN
INTRAMUSCULAR | Status: AC
  Filled 2021-03-01: qty 2

## 2021-03-01 MED ORDER — SODIUM CHLORIDE 0.9% FLUSH: 3.0000 mL | INTRAVENOUS | Status: DC | PRN

## 2021-03-01 MED ORDER — MISOPROSTOL 25 MCG QUARTER TABLET
25.0000 ug | ORAL_TABLET | ORAL | Status: DC | PRN
  Administered 2021-03-01: 25 ug via VAGINAL
  Filled 2021-03-01: qty 1

## 2021-03-01 MED ORDER — LIDOCAINE HCL (PF) 1 % IJ SOLN
INTRAMUSCULAR | Status: AC
  Filled 2021-03-01: qty 30

## 2021-03-01 MED ORDER — PHENYLEPHRINE 40 MCG/ML (10ML) SYRINGE FOR IV PUSH (FOR BLOOD PRESSURE SUPPORT)
80.0000 ug | PREFILLED_SYRINGE | INTRAVENOUS | Status: DC | PRN
  Filled 2021-03-01: qty 10

## 2021-03-01 MED ORDER — LACTATED RINGERS IV SOLN
INTRAVENOUS | Status: DC
Start: 2021-03-01 — End: 2021-03-01

## 2021-03-01 MED ORDER — ACETAMINOPHEN 325 MG PO TABS: 650.0000 mg | ORAL_TABLET | ORAL | Status: DC | PRN

## 2021-03-01 MED ORDER — ONDANSETRON HCL 4 MG PO TABS: 4.0000 mg | ORAL_TABLET | ORAL | Status: DC | PRN

## 2021-03-01 MED ORDER — ZOLPIDEM TARTRATE 5 MG PO TABS: 5.0000 mg | ORAL_TABLET | Freq: Every evening | ORAL | Status: DC | PRN

## 2021-03-01 MED ORDER — LACTATED RINGERS IV SOLN: 500.0000 mL | Freq: Once | INTRAVENOUS | Status: DC

## 2021-03-01 MED ORDER — SODIUM CHLORIDE 0.9% FLUSH
3.0000 mL | Freq: Two times a day (BID) | INTRAVENOUS | Status: DC
  Administered 2021-03-02: 3 mL via INTRAVENOUS

## 2021-03-01 MED ORDER — DIPHENHYDRAMINE HCL 25 MG PO CAPS: 25.0000 mg | ORAL_CAPSULE | Freq: Four times a day (QID) | ORAL | Status: DC | PRN

## 2021-03-01 MED ORDER — OXYTOCIN 10 UNIT/ML IJ SOLN
INTRAMUSCULAR | Status: AC
  Filled 2021-03-01: qty 2

## 2021-03-01 MED ORDER — PRENATAL MULTIVITAMIN CH
1.0000 | ORAL_TABLET | Freq: Every day | ORAL | Status: DC
  Administered 2021-03-02: 1 via ORAL
  Filled 2021-03-01: qty 1

## 2021-03-01 MED ORDER — ONDANSETRON HCL 4 MG/2ML IJ SOLN: 4.0000 mg | INTRAMUSCULAR | Status: DC | PRN

## 2021-03-01 MED ORDER — FENTANYL CITRATE (PF) 100 MCG/2ML IJ SOLN
100.0000 ug | INTRAMUSCULAR | Status: DC | PRN
  Administered 2021-03-01 (×2): 100 ug via INTRAVENOUS
  Filled 2021-03-01: qty 2

## 2021-03-01 MED ORDER — AMMONIA AROMATIC IN INHA
RESPIRATORY_TRACT | Status: AC
  Filled 2021-03-01: qty 10

## 2021-03-01 MED ORDER — MISOPROSTOL 200 MCG PO TABS
ORAL_TABLET | ORAL | Status: AC
  Filled 2021-03-01: qty 4

## 2021-03-01 MED ORDER — COCONUT OIL OIL
1.0000 "application " | TOPICAL_OIL | Status: DC | PRN
  Filled 2021-03-01: qty 120

## 2021-03-01 MED ORDER — SODIUM CHLORIDE 0.9 % IV SOLN: 250.0000 mL | INTRAVENOUS | Status: DC | PRN

## 2021-03-01 MED ORDER — BUTORPHANOL TARTRATE 1 MG/ML IJ SOLN
1.0000 mg | INTRAMUSCULAR | Status: DC | PRN
  Administered 2021-03-01: 1 mg via INTRAVENOUS
  Filled 2021-03-01: qty 1

## 2021-03-01 MED ORDER — LACTATED RINGERS IV SOLN
500.0000 mL | INTRAVENOUS | Status: DC | PRN
  Administered 2021-03-01: 500 mL via INTRAVENOUS

## 2021-03-01 MED ORDER — OXYTOCIN BOLUS FROM INFUSION
333.0000 mL | Freq: Once | INTRAVENOUS | Status: AC
  Administered 2021-03-01: 333 mL via INTRAVENOUS

## 2021-03-01 MED ORDER — IBUPROFEN 600 MG PO TABS
600.0000 mg | ORAL_TABLET | Freq: Four times a day (QID) | ORAL | Status: DC
  Administered 2021-03-01 – 2021-03-02 (×4): 600 mg via ORAL
  Filled 2021-03-01 (×4): qty 1

## 2021-03-01 MED ORDER — BUPIVACAINE HCL (PF) 0.25 % IJ SOLN
INTRAMUSCULAR | Status: DC | PRN
Start: 2021-03-01 — End: 2021-03-01
  Administered 2021-03-01: 2 mL via EPIDURAL
  Administered 2021-03-01: 5 mL via EPIDURAL

## 2021-03-01 MED ORDER — TERBUTALINE SULFATE 1 MG/ML IJ SOLN: 0.2500 mg | Freq: Once | INTRAMUSCULAR | Status: DC | PRN

## 2021-03-01 MED ORDER — MISOPROSTOL 25 MCG QUARTER TABLET
25.0000 ug | ORAL_TABLET | ORAL | Status: DC | PRN
Start: 2021-03-01 — End: 2021-03-01
  Administered 2021-03-01: 25 ug via BUCCAL
  Filled 2021-03-01: qty 1

## 2021-03-01 MED ORDER — OXYCODONE HCL 5 MG PO TABS: 5.0000 mg | ORAL_TABLET | ORAL | Status: DC | PRN

## 2021-03-01 MED ORDER — FENTANYL-BUPIVACAINE-NACL 0.5-0.125-0.9 MG/250ML-% EP SOLN
EPIDURAL | Status: AC
  Filled 2021-03-01: qty 250

## 2021-03-01 MED ORDER — SENNOSIDES-DOCUSATE SODIUM 8.6-50 MG PO TABS
2.0000 | ORAL_TABLET | ORAL | Status: DC
  Administered 2021-03-02: 2 via ORAL
  Filled 2021-03-01: qty 2

## 2021-03-01 MED ORDER — LIDOCAINE-EPINEPHRINE (PF) 1.5 %-1:200000 IJ SOLN
INTRAMUSCULAR | Status: DC | PRN
  Administered 2021-03-01: 3 mL via PERINEURAL

## 2021-03-01 MED ORDER — OXYTOCIN-SODIUM CHLORIDE 30-0.9 UT/500ML-% IV SOLN
1.0000 m[IU]/min | INTRAVENOUS | Status: DC
  Filled 2021-03-01: qty 1000

## 2021-03-01 MED ORDER — FLEET ENEMA 7-19 GM/118ML RE ENEM
1.0000 | ENEMA | Freq: Every day | RECTAL | Status: DC | PRN
Start: 2021-03-01 — End: 2021-03-03

## 2021-03-01 MED ORDER — SIMETHICONE 80 MG PO CHEW: 80.0000 mg | CHEWABLE_TABLET | ORAL | Status: DC | PRN

## 2021-03-01 MED ORDER — OXYTOCIN-SODIUM CHLORIDE 30-0.9 UT/500ML-% IV SOLN: 2.5000 [IU]/h | INTRAVENOUS | Status: DC

## 2021-03-01 MED ORDER — LIDOCAINE HCL (PF) 1 % IJ SOLN
30.0000 mL | INTRAMUSCULAR | Status: DC | PRN
  Filled 2021-03-01: qty 30

## 2021-03-01 MED ORDER — DIBUCAINE (PERIANAL) 1 % EX OINT
1.0000 "application " | TOPICAL_OINTMENT | CUTANEOUS | Status: DC | PRN
  Administered 2021-03-02: 1 via RECTAL
  Filled 2021-03-01: qty 28

## 2021-03-01 MED ORDER — DIPHENHYDRAMINE HCL 50 MG/ML IJ SOLN: 12.5000 mg | INTRAMUSCULAR | Status: DC | PRN

## 2021-03-01 MED ORDER — SENNOSIDES-DOCUSATE SODIUM 8.6-50 MG PO TABS
2.0000 | ORAL_TABLET | ORAL | Status: DC
Start: 2021-03-01 — End: 2021-03-01

## 2021-03-01 MED ORDER — WITCH HAZEL-GLYCERIN EX PADS
1.0000 "application " | MEDICATED_PAD | CUTANEOUS | Status: DC | PRN
  Administered 2021-03-02: 1 via TOPICAL
  Filled 2021-03-01: qty 100

## 2021-03-01 MED ORDER — FENTANYL-BUPIVACAINE-NACL 0.5-0.125-0.9 MG/250ML-% EP SOLN
12.0000 mL/h | EPIDURAL | Status: DC | PRN
  Administered 2021-03-01: 12 mL/h via EPIDURAL

## 2021-03-01 NOTE — Progress Notes (Deleted)
error 

## 2021-03-01 NOTE — Discharge Summary (Signed)
Obstetrical Discharge Summary  Patient Name: Sherry Reynolds DOB: 1990-12-19 MRN: [id number]  Date of Admission: 03/01/2021 Date of Delivery: 03/01/21  Delivered by: Dr Jean Rosenthal Date of Discharge: 03/02/2021  Primary OB: [facility] NID:[id number] last menstrual period was 05/12/2020. EDC Estimated Date of Delivery: 03/01/21 Gestational Age at Delivery: [redacted]w[redacted]d   Antepartum complications:  Anxiety/Depression HX of recurrent miscarriages ADD Tobacco use  Admitting Diagnosis: elective IOL Secondary Diagnosis: Patient Active Problem List   Diagnosis Date Noted   Encounter for planned induction of labor 03/01/2021   Nausea and vomiting during pregnancy 12/31/2018   Palpitations 12/31/2018   History of recurrent miscarriages 06/23/2018   Encounter for supervision of normal pregnancy in third trimester 06/23/2018   ADD (attention deficit disorder) 02/29/2016   Anxiety and depression 03/09/2014   Family history of ovarian cancer 12/21/2013   Bilateral bunions 09/18/2013   Tobacco use 09/18/2013   Other specified behavioral and emotional disorders with onset usually occurring in childhood and adolescence 01/20/2013    Augmentation: AROM Complications: None Intrapartum complications/course: see delivery  Delivery Type: spontaneous vaginal delivery Anesthesia: epidural Placenta: spontaneous Laceration: none Episiotomy: none Newborn Data: Live born female  Birth Weight:  6#15 APGAR: 8, 9  Newborn Delivery   Birth date/time: 03/01/2021 18:48:00 Delivery type: Vaginal, Spontaneous     Postpartum Procedures: none  Edinburgh:  Edinburgh Postnatal Depression Scale Screening Tool 03/02/2021 03/01/2021 08/14/2019  I have been able to laugh and see the funny side of things. 0 (No Data) 0  I have looked forward with enjoyment to things. 0 - 0  I have blamed myself unnecessarily when things went wrong. 0 - 0  I have been anxious or worried for no good reason. 0 - 0  I have  felt scared or panicky for no good reason. 0 - 0  Things have been getting on top of me. 0 - 1  I have been so unhappy that I have had difficulty sleeping. 0 - 0  I have felt sad or miserable. 0 - 0  I have been so unhappy that I have been crying. 0 - 0  The thought of harming myself has occurred to me. 0 - 0  Edinburgh Postnatal Depression Scale Total 0 - 1     Post partum course:  Patient had an uncomplicated postpartum course.  By time of discharge on PPD#1, her pain was controlled on oral pain medications; she had appropriate lochia and was ambulating, voiding without difficulty and tolerating regular diet.  She was deemed stable for discharge to home.    Discharge Physical Exam:  BP 115/71 (BP Location: Right Arm)    Pulse 88    Temp 98.3 F (36.8 C) (Oral)    Resp 18    Ht 5\' 1"  (1.549 m)    Wt 71.7 kg    LMP 05/12/2020    SpO2 99%    Breastfeeding Unknown    BMI 29.85 kg/m   General: NAD CV: RRR Pulm: CTABL, nl effort ABD: s/nd/nt, fundus firm and below the umbilicus Lochia: moderate Perineum:minimal edema/intact DVT Evaluation: LE non-ttp, no evidence of DVT on exam.  Hemoglobin  Date Value Ref Range Status  03/02/2021 9.4 (L) 12.0 - 15.0 g/dL Final  61/44/3154 00.8 (L) 11.1 - 15.9 g/dL Final   HCT  Date Value Ref Range Status  03/02/2021 28.5 (L) 36.0 - 46.0 % Final   Hematocrit  Date Value Ref Range Status  12/06/2020 32.2 (L) 34.0 - 46.6 % Final  Disposition: stable, discharge to home. Baby Feeding: breastmilk Baby Disposition: home with mom  Rh Immune globulin given: N/A Rubella vaccine given: N/A Varivax vaccine given: N/A Flu vaccine given in AP or PP setting: declined Tdap vaccine given in AP or PP setting: 01/03/21  Contraception: condoms  Prenatal Labs:  Blood type/Rh B Pos  Antibody screen neg  Rubella Immune  Varicella Immune  RPR NR  HBsAg Neg  HIV NR  GC neg  Chlamydia neg  Genetic screening cfDNA negative   1 hour GTT 112  3  hour GTT N/A  GBS Neg    Plan:  Sherry Reynolds was discharged to home in good condition. Follow-up appointment with delivering provider in 6 weeks.  Discharge Medications: Allergies as of 03/02/2021   No Known Allergies      Medication List     TAKE these medications    acetaminophen 325 MG tablet Commonly known as: Tylenol Take 2 tablets (650 mg total) by mouth every 4 (four) hours as needed (for pain scale < 4).   benzocaine-Menthol 20-0.5 % Aero Commonly known as: DERMOPLAST Apply 1 application topically as needed for irritation (perineal discomfort).   coconut oil Oil Apply 1 application topically as needed.   diphenhydrAMINE 25 mg capsule Commonly known as: BENADRYL Take 1 capsule (25 mg total) by mouth every 6 (six) hours as needed for itching.   ibuprofen 600 MG tablet Commonly known as: ADVIL Take 1 tablet (600 mg total) by mouth every 6 (six) hours. Start taking on: March 03, 2021   ondansetron 4 MG disintegrating tablet Commonly known as: Zofran ODT Take 1 tablet (4 mg total) by mouth every 8 (eight) hours as needed for nausea or vomiting.   senna-docusate 8.6-50 MG tablet Commonly known as: Senokot-S Take 2 tablets by mouth daily. Start taking on: March 03, 2021   witch hazel-glycerin pad Commonly known as: TUCKS Apply 1 application topically as needed for hemorrhoids (for pain).         Follow-up [facility] Follow up in 2 week(s).   Why: mood check Contact information: [address] [place]        Will Bonnet, MD Follow up in 6 week(s).   Specialty: Obstetrics and Gynecology Why: pp visit Contact information: [address] [place] [address] [phone]                 Signed: Francetta Found, CNM 03/02/2021  6:41 PM

## 2021-03-01 NOTE — Progress Notes (Signed)
Labor Progress Note  Sherry Reynolds is a 31 y.o. [id number] at [redacted]w[redacted]d by ultrasound admitted for induction of labor due to Elective at term.  Subjective: resting in bed requesting epidural  Objective: BP (!) 121/53 (BP Location: Right Arm)    Pulse 81    Temp 97.9 F (36.6 C) (Oral)    Resp 16    Ht 5\' 1"  (1.549 m)    Wt 71.7 kg    LMP 05/12/2020    BMI 29.85 kg/m  Notable VS details:   Fetal Assessment: FHT:  FHR: 125 bpm, variability: moderate,  accelerations:  Present,  decelerations:  Absent Category/reactivity:  Category I UC:   regular, every 2-4 minutes SVE:   7/80/-1 Membrane status: AROM Amniotic color: Clear  Labs: Lab Results  Component Value Date   WBC 7.1 03/01/2021   HGB 10.1 (L) 03/01/2021   HCT 30.7 (L) 03/01/2021   MCV 89.0 03/01/2021   PLT 172 03/01/2021    Assessment / Plan: Induction of labor progressing well s/p 1 dose of buccal and vaginal Cytotec  Labor: progressing well after 1 dose of cyctotec, Arom at 1738, small amount of clear fluid Preeclampsia:   N/A Fetal Wellbeing:  Category I Pain Control:  Epidural I/D:   GBS neg, AROM  Anticipated MOD:  NSVD   Avelino Leeds CNM

## 2021-03-01 NOTE — Anesthesia Preprocedure Evaluation (Signed)
Anesthesia Evaluation  Patient identified by MRN, date of birth, ID band Patient awake    Reviewed: Allergy & Precautions, H&P , NPO status , Patient's Chart, lab work & pertinent test results  History of Anesthesia Complications Negative for: history of anesthetic complications  Airway Mallampati: II  TM Distance: >3 FB Neck ROM: full    Dental no notable dental hx.    Pulmonary neg pulmonary ROS, former smoker,           Cardiovascular negative cardio ROS Normal cardiovascular exam     Neuro/Psych negative neurological ROS  negative psych ROS   GI/Hepatic Neg liver ROS,   Endo/Other  negative endocrine ROS  Renal/GU negative Renal ROS  negative genitourinary   Musculoskeletal   Abdominal   Peds  Hematology negative hematology ROS (+)   Anesthesia Other Findings   Reproductive/Obstetrics (+) Pregnancy                             Anesthesia Physical Anesthesia Plan  ASA: 2  Anesthesia Plan: Epidural   Post-op Pain Management:    Induction:   PONV Risk Score and Plan:   Airway Management Planned:   Additional Equipment:   Intra-op Plan:   Post-operative Plan:   Informed Consent: I have reviewed the patients History and Physical, chart, labs and discussed the procedure including the risks, benefits and alternatives for the proposed anesthesia with the patient or authorized representative who has indicated his/her understanding and acceptance.       Plan Discussed with: Anesthesiologist and CRNA  Anesthesia Plan Comments:         Anesthesia Quick Evaluation

## 2021-03-01 NOTE — H&P (Signed)
OB History & Physical   History of Present Illness:   Chief Complaint: planned elective induction of labor  HPI:  Sherry Reynolds is a 31 y.o. [id number] female at [redacted]w[redacted]d dated by [place].  She presents to L&D for planned elective IOL  Reports active fetal movement  Contractions:  occasional, irritability LOF/SROM: intact Vaginal bleeding: none  Factors complicating pregnancy:  Anxiety/Depression HX of recurrent miscarriages ADD Tobacco use  Patient Active Problem List   Diagnosis Date Noted   Encounter for planned induction of labor 03/01/2021   Supervision of other normal pregnancy, antepartum 07/27/2020   Nausea and vomiting during pregnancy 12/31/2018   Palpitations 12/31/2018   History of recurrent miscarriages 06/23/2018   ADD (attention deficit disorder) 02/29/2016   Anxiety and depression 03/09/2014   Family history of ovarian cancer 12/21/2013   Bilateral bunions 09/18/2013   Tobacco use 09/18/2013   Other specified behavioral and emotional disorders with onset usually occurring in childhood and adolescence 01/20/2013     Maternal Medical History:   Past Medical History:  Diagnosis Date   ADD (attention deficit disorder)    History of miscarriage    Tibial plateau fracture, right 12/07/2019    Past Surgical History:  Procedure Laterality Date   BREAST ENHANCEMENT SURGERY Bilateral 2013 approx.   ORIF TIBIA PLATEAU Right 12/15/2019   Procedure: OPEN REDUCTION INTERNAL FIXATION (ORIF) TIBIAL PLATEAU;  Surgeon: Nicholes Stairs, MD;  Location: [facility];  Service: Orthopedics;  Laterality: Right;   WISDOM TOOTH EXTRACTION      No Known Allergies  Prior to Admission medications   Medication Sig Start Date End Date Taking? Authorizing Provider  ondansetron (ZOFRAN ODT) 4 MG disintegrating tablet Take 1 tablet (4 mg total) by mouth every 8 (eight) hours as needed for nausea or vomiting. 01/17/21  Yes Gae Dry, MD     Prenatal care site:   [facility]  Social History: She  reports that she quit smoking about 2 years ago. Her smoking use included cigarettes. She has never used smokeless tobacco. She reports that she does not currently use alcohol after a past usage of about 2.0 - 3.0 standard drinks per week. She reports that she does not use drugs.  Family History: family history includes Endometriosis in her mother.   Review of Systems: A full review of systems was performed and negative except as noted in the HPI.     Physical Exam:  Vital Signs: BP 114/73 (BP Location: Left Arm)    Pulse 82    Temp 98.6 F (37 C) (Oral)    Resp 18    Ht 5\' 1"  (1.549 m)    Wt 71.7 kg    LMP 05/12/2020    BMI 29.85 kg/m  Physical Exam Constitutional:      Appearance: Normal appearance.  HENT:     Head: Normocephalic and atraumatic.  Cardiovascular:     Rate and Rhythm: Normal rate and regular rhythm.     Pulses: Normal pulses.     Heart sounds: Normal heart sounds.  Pulmonary:     Effort: Pulmonary effort is normal.     Breath sounds: Normal breath sounds.  Abdominal:     Palpations: Abdomen is soft.     Comments: gravid  Musculoskeletal:        General: Normal range of motion.     Cervical back: Normal range of motion and neck supple.  Skin:    General: Skin is warm and dry.  Neurological:     Mental Status: She is alert and oriented to person, place, and time.  Psychiatric:        Mood and Affect: Mood normal.        Behavior: Behavior normal.        Thought Content: Thought content normal.        Judgment: Judgment normal.    External: Normal external female genitalia  Cervix: Dilation: Fingertip / Effacement (%): 50 / Station: -2    Extremities: non-tender, symmetric, no edema bilaterally.  DTRs: +2   Results for orders placed or performed during the hospital encounter of 03/01/21 (from the past 24 hour(s))  CBC     Status: Abnormal   Collection Time: 03/01/21 10:49 AM  Result Value Ref Range   WBC  7.1 4.0 - 10.5 K/uL   RBC 3.45 (L) 3.87 - 5.11 MIL/uL   Hemoglobin 10.1 (L) 12.0 - 15.0 g/dL   HCT 30.7 (L) 36.0 - 46.0 %   MCV 89.0 80.0 - 100.0 fL   MCH 29.3 26.0 - 34.0 pg   MCHC 32.9 30.0 - 36.0 g/dL   RDW 12.0 11.5 - 15.5 %   Platelets 172 150 - 400 K/uL   nRBC 0.0 0.0 - 0.2 %  Type and screen [place]     Status: None   Collection Time: 03/01/21 10:49 AM  Result Value Ref Range   ABO/RH(D) B POS    Antibody Screen NEG    Sample Expiration      03/04/2021,2359 Performed at [facility], [address] [place], [place] [address]     Pertinent Results:  Prenatal Labs: Blood type/Rh B Pos  Antibody screen neg  Rubella Immune  Varicella Immune  RPR NR  HBsAg Neg  HIV NR  GC neg  Chlamydia neg  Genetic screening cfDNA negative   1 hour GTT 112  3 hour GTT N/A  GBS Neg   FHT:  FHR: 130 bpm, variability: moderate,  accelerations:  Present,  decelerations:  Absent Category/reactivity:  Category I UC:   occasional   Cephalic by [place] and SVE   No results found.  Assessment:  Sherry Reynolds is a 31 y.o. [phone] female at [redacted]w[redacted]d with hx of recurrent miscarriages, anxiety/depression, tobacco use.   Plan:  1. Admit to Labor & Delivery; consents reviewed and obtained - Covid admission screen   2. Fetal Well being  - Fetal Tracing: Category 1 - Group B Streptococcus ppx not indicated: GBS neg - Presentation: vertex confirmed by SVE   3. Routine OB: - Prenatal labs reviewed, as above - Rh pos - CBC, T&S, RPR on admit - Clear fluids, IVF  4. Induction of labor  - Contractions monitored with external toco - Pelvis proven to 7lbs  - Plan for induction with misoprostol and oxytocin  - Augmentation with oxytocin and AROM as appropriate  - Plan for  continuous fetal monitoring - Maternal pain control as desired; planning regional anesthesia and IVPM - Anticipate vaginal delivery  5. Post Partum Planning: - Infant feeding:  TBD - Contraception: TBD - Tdap vaccine: 01/03/21 - Flu vaccine: declined   Avelino Leeds, CNM Certified Nurse Midwife [facility]

## 2021-03-02 LAB — RPR: RPR Ser Ql: NONREACTIVE

## 2021-03-02 LAB — CBC
HCT: 28.5 % — ABNORMAL LOW (ref 36.0–46.0)
Hemoglobin: 9.4 g/dL — ABNORMAL LOW (ref 12.0–15.0)
MCH: 29 pg (ref 26.0–34.0)
MCHC: 33 g/dL (ref 30.0–36.0)
MCV: 88 fL (ref 80.0–100.0)
Platelets: 158 10*3/uL (ref 150–400)
RBC: 3.24 MIL/uL — ABNORMAL LOW (ref 3.87–5.11)
RDW: 11.9 % (ref 11.5–15.5)
WBC: 7.3 10*3/uL (ref 4.0–10.5)
nRBC: 0 % (ref 0.0–0.2)

## 2021-03-02 MED ORDER — DIPHENHYDRAMINE HCL 25 MG PO CAPS: 25.0000 mg | ORAL_CAPSULE | Freq: Four times a day (QID) | ORAL | 0 refills | Status: AC | PRN

## 2021-03-02 MED ORDER — BENZOCAINE-MENTHOL 20-0.5 % EX AERO: 1.0000 "application " | INHALATION_SPRAY | CUTANEOUS | Status: AC | PRN

## 2021-03-02 MED ORDER — ACETAMINOPHEN 325 MG PO TABS: 650.0000 mg | ORAL_TABLET | ORAL | Status: AC | PRN

## 2021-03-02 MED ORDER — SENNOSIDES-DOCUSATE SODIUM 8.6-50 MG PO TABS: 2.0000 | ORAL_TABLET | ORAL | 0 refills | Status: AC

## 2021-03-02 MED ORDER — COCONUT OIL OIL: 1.0000 "application " | TOPICAL_OIL | 0 refills | Status: AC | PRN

## 2021-03-02 MED ORDER — WITCH HAZEL-GLYCERIN EX PADS: 1.0000 "application " | MEDICATED_PAD | CUTANEOUS | 12 refills | Status: AC | PRN

## 2021-03-02 MED ORDER — IBUPROFEN 600 MG PO TABS: 600.0000 mg | ORAL_TABLET | Freq: Four times a day (QID) | ORAL | 0 refills | Status: AC

## 2021-03-02 NOTE — Progress Notes (Signed)
Reviewed D/C instructions with pt and family. Pt verbalized understanding of teaching. Discharged to home via W/C. Pt to schedule f/u appt.  

## 2021-03-08 DIAGNOSIS — Z419 Encounter for procedure for purposes other than remedying health state, unspecified: Secondary | ICD-10-CM | POA: Diagnosis not present

## 2021-03-12 NOTE — Anesthesia Postprocedure Evaluation (Signed)
Anesthesia Post Note ? ?Patient: Sherry Reynolds ? ?Procedure(s) Performed: AN AD HOC LABOR EPIDURAL ? ?Anesthesia Type: Epidural ?Respiratory status: respiratory function stable ?Cardiovascular status: blood pressure returned to baseline ?Postop Assessment: epidural receding, no backache and no headache ?Anesthetic complications: no ?Comments: Patient discharged prior to post-operative assessment. Nursing notes and vitals reviewed with no apparent anesthetic complications. ? ? ?No notable events documented. ? ? ?Last Vitals: There were no vitals filed for this visit.  ?Last Pain: There were no vitals filed for this visit. ? ?  ?  ?  ?  ?  ?  ? ?Corinda Gubler ? ? ? ? ? ?

## 2021-04-08 DIAGNOSIS — Z419 Encounter for procedure for purposes other than remedying health state, unspecified: Secondary | ICD-10-CM | POA: Diagnosis not present

## 2021-05-08 DIAGNOSIS — Z419 Encounter for procedure for purposes other than remedying health state, unspecified: Secondary | ICD-10-CM | POA: Diagnosis not present

## 2021-06-08 DIAGNOSIS — Z419 Encounter for procedure for purposes other than remedying health state, unspecified: Secondary | ICD-10-CM | POA: Diagnosis not present

## 2021-07-08 DIAGNOSIS — Z419 Encounter for procedure for purposes other than remedying health state, unspecified: Secondary | ICD-10-CM | POA: Diagnosis not present
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
## 2021-08-08 DIAGNOSIS — Z419 Encounter for procedure for purposes other than remedying health state, unspecified: Secondary | ICD-10-CM | POA: Diagnosis not present

## 2021-09-08 DIAGNOSIS — Z419 Encounter for procedure for purposes other than remedying health state, unspecified: Secondary | ICD-10-CM | POA: Diagnosis not present

## 2021-10-08 DIAGNOSIS — Z419 Encounter for procedure for purposes other than remedying health state, unspecified: Secondary | ICD-10-CM | POA: Diagnosis not present

## 2021-11-08 DIAGNOSIS — Z419 Encounter for procedure for purposes other than remedying health state, unspecified: Secondary | ICD-10-CM | POA: Diagnosis not present

## 2021-12-08 DIAGNOSIS — Z419 Encounter for procedure for purposes other than remedying health state, unspecified: Secondary | ICD-10-CM | POA: Diagnosis not present

## 2022-01-08 DIAGNOSIS — Z419 Encounter for procedure for purposes other than remedying health state, unspecified: Secondary | ICD-10-CM | POA: Diagnosis not present

## 2022-02-08 DIAGNOSIS — Z419 Encounter for procedure for purposes other than remedying health state, unspecified: Secondary | ICD-10-CM | POA: Diagnosis not present

## 2022-03-09 DIAGNOSIS — Z419 Encounter for procedure for purposes other than remedying health state, unspecified: Secondary | ICD-10-CM | POA: Diagnosis not present

## 2022-04-09 DIAGNOSIS — Z419 Encounter for procedure for purposes other than remedying health state, unspecified: Secondary | ICD-10-CM | POA: Diagnosis not present

## 2022-05-09 DIAGNOSIS — Z419 Encounter for procedure for purposes other than remedying health state, unspecified: Secondary | ICD-10-CM | POA: Diagnosis not present

## 2022-06-09 DIAGNOSIS — Z419 Encounter for procedure for purposes other than remedying health state, unspecified: Secondary | ICD-10-CM | POA: Diagnosis not present

## 2022-07-09 DIAGNOSIS — Z419 Encounter for procedure for purposes other than remedying health state, unspecified: Secondary | ICD-10-CM | POA: Diagnosis not present

## 2022-08-09 DIAGNOSIS — Z419 Encounter for procedure for purposes other than remedying health state, unspecified: Secondary | ICD-10-CM | POA: Diagnosis not present

## 2022-09-09 DIAGNOSIS — Z419 Encounter for procedure for purposes other than remedying health state, unspecified: Secondary | ICD-10-CM | POA: Diagnosis not present

## 2022-10-09 DIAGNOSIS — Z419 Encounter for procedure for purposes other than remedying health state, unspecified: Secondary | ICD-10-CM | POA: Diagnosis not present

## 2022-10-23 ENCOUNTER — Other Ambulatory Visit: Payer: Self-pay

## 2022-10-23 ENCOUNTER — Emergency Department (HOSPITAL_COMMUNITY)
Admission: EM | Admit: 2022-10-23 | Discharge: 2022-10-24 | Disposition: A | Discharge status: Home / Self Care | Payer: Medicaid Other | Attending: Emergency Medicine | Admitting: Emergency Medicine

## 2022-10-23 DIAGNOSIS — R079 Chest pain, unspecified: Secondary | ICD-10-CM | POA: Diagnosis not present

## 2022-10-23 DIAGNOSIS — R0789 Other chest pain: Secondary | ICD-10-CM | POA: Diagnosis not present

## 2022-10-23 DIAGNOSIS — Z1152 Encounter for screening for COVID-19: Secondary | ICD-10-CM | POA: Insufficient documentation

## 2022-10-23 NOTE — ED Triage Notes (Signed)
Patient reports central chest pain/heaviness onset yesterday , denies emesis or diaphoresis , respirations unlabored.

## 2022-10-24 ENCOUNTER — Emergency Department (HOSPITAL_COMMUNITY): Payer: Medicaid Other

## 2022-10-24 DIAGNOSIS — R079 Chest pain, unspecified: Secondary | ICD-10-CM | POA: Diagnosis not present

## 2022-10-24 LAB — CBC
HCT: 33.6 % — ABNORMAL LOW (ref 36.0–46.0)
Hemoglobin: 11.1 g/dL — ABNORMAL LOW (ref 12.0–15.0)
MCH: 30.2 pg (ref 26.0–34.0)
MCHC: 33 g/dL (ref 30.0–36.0)
MCV: 91.6 fL (ref 80.0–100.0)
Platelets: 265 10*3/uL (ref 150–400)
RBC: 3.67 MIL/uL — ABNORMAL LOW (ref 3.87–5.11)
RDW: 11.7 % (ref 11.5–15.5)
WBC: 5.1 10*3/uL (ref 4.0–10.5)
nRBC: 0 % (ref 0.0–0.2)

## 2022-10-24 LAB — SARS CORONAVIRUS 2 BY RT PCR: SARS Coronavirus 2 by RT PCR: NEGATIVE

## 2022-10-24 LAB — BASIC METABOLIC PANEL
Anion gap: 8 (ref 5–15)
BUN: 9 mg/dL (ref 6–20)
CO2: 23 mmol/L (ref 22–32)
Calcium: 9 mg/dL (ref 8.9–10.3)
Chloride: 107 mmol/L (ref 98–111)
Creatinine, Ser: 0.6 mg/dL (ref 0.44–1.00)
GFR, Estimated: >60 mL/min (ref >60)
Glucose, Bld: 112 mg/dL — ABNORMAL HIGH (ref 70–99)
Potassium: 3.8 mmol/L (ref 3.5–5.1)
Sodium: 138 mmol/L (ref 135–145)

## 2022-10-24 LAB — TROPONIN I (HIGH SENSITIVITY)
Troponin I (High Sensitivity): <2 ng/L (ref <18)
Troponin I (High Sensitivity): <2 ng/L (ref <18)

## 2022-10-24 LAB — HCG, SERUM, QUALITATIVE: Preg, Serum: NEGATIVE

## 2022-10-24 NOTE — Discharge Instructions (Signed)
You have been seen today for your complaint of chest pain. Your lab work was reassuring. Your imaging was reassuring. Follow up with: Your PCP in 1 week for reevaluation Please seek immediate medical care if you develop any of the following symptoms: Your chest pain gets worse. You have a cough that gets worse, or you cough up blood. You have severe pain in your abdomen. You faint. You have sudden, unexplained chest discomfort. You have sudden, unexplained discomfort in your arms, back, neck, or jaw. You have shortness of breath at any time. You suddenly start to sweat, or your skin gets clammy. You feel nausea or you vomit. You suddenly feel lightheaded or dizzy. You have severe weakness, or unexplained weakness or fatigue. Your heart begins to beat quickly, or it feels like it is skipping beats. At this time there does not appear to be the presence of an emergent medical condition, however there is always the potential for conditions to change. Please read and follow the below instructions.  Do not take your medicine if  develop an itchy rash, swelling in your mouth or lips, or difficulty breathing; call 911 and seek immediate emergency medical attention if this occurs.  You may review your lab tests and imaging results in their entirety on your MyChart account.  Please discuss all results of fully with your primary care provider and other specialist at your follow-up visit.  Note: Portions of this text may have been transcribed using voice recognition software. Every effort was made to ensure accuracy; however, inadvertent computerized transcription errors may still be present.

## 2022-10-24 NOTE — ED Provider Notes (Signed)
[place] EMERGENCY DEPARTMENT AT [facility] Provider Note   CSN: [id number] Arrival date & time: 10/23/22  2341     History  Chief Complaint  Patient presents with   Chest Pain    Sherry Reynolds is a 32 y.o. female.  With a history of ADHD presenting to the ED for evaluation of chest pain.  She states this pain began approximately 24 hours prior to my assessment.  Is localized to the substernal region with some radiation to the left upper chest.  The pain is intermittent in nature and occurs at random.  Is not associated with activity.  Some mild nausea but no vomiting or diaphoresis.  No known cardiac disease.  No significant family history of cardiac disease.  She describes the pain as a annoying pressure.  She denies any specific shortness of breath.  No cough or hemoptysis.  No unilateral leg swelling, history of DVT or PE, estrogen use, recent cancer treatments, surgeries, long distance travel.   Chest Pain      Home Medications Prior to Admission medications   Medication Sig Start Date End Date Taking? Authorizing Provider  acetaminophen (TYLENOL) 325 MG tablet Take 2 tablets (650 mg total) by mouth every 4 (four) hours as needed (for pain scale < 4). 03/02/21   McVey, Prudencio Pair, CNM  benzocaine-Menthol (DERMOPLAST) 20-0.5 % AERO Apply 1 application topically as needed for irritation (perineal discomfort). 03/02/21   McVey, Prudencio Pair, CNM  coconut oil OIL Apply 1 application topically as needed. 03/02/21   McVey, Prudencio Pair, CNM  diphenhydrAMINE (BENADRYL) 25 mg capsule Take 1 capsule (25 mg total) by mouth every 6 (six) hours as needed for itching. 03/02/21   McVey, Prudencio Pair, CNM  ibuprofen (ADVIL) 600 MG tablet Take 1 tablet (600 mg total) by mouth every 6 (six) hours. 03/03/21   McVey, Prudencio Pair, CNM  ondansetron (ZOFRAN ODT) 4 MG disintegrating tablet Take 1 tablet (4 mg total) by mouth every 8 (eight) hours as needed for nausea or vomiting. 01/17/21   Nadara Mustard, MD  senna-docusate (SENOKOT-S) 8.6-50 MG tablet Take 2 tablets by mouth daily. 03/03/21   McVey, Prudencio Pair, CNM  witch hazel-glycerin (TUCKS) pad Apply 1 application topically as needed for hemorrhoids (for pain). 03/02/21   McVey, Prudencio Pair, CNM      Allergies    Patient has no known allergies.    Review of Systems   Review of Systems  Cardiovascular:  Positive for chest pain.  All other systems reviewed and are negative.   Physical Exam Updated Vital Signs BP (!) 135/98 (BP Location: Right Arm)   Pulse 84   Temp 98.2 F (36.8 C)   Resp 17   SpO2 100%  Physical Exam Vitals and nursing note reviewed.  Constitutional:      General: She is not in acute distress.    Appearance: She is well-developed.     Comments: Resting comfortably in bed  HENT:     Head: Normocephalic and atraumatic.  Eyes:     Conjunctiva/sclera: Conjunctivae normal.  Cardiovascular:     Rate and Rhythm: Normal rate and regular rhythm.  Pulmonary:     Effort: Pulmonary effort is normal. No respiratory distress.     Breath sounds: Normal breath sounds. No decreased breath sounds, wheezing, rhonchi or rales.  Abdominal:     Palpations: Abdomen is soft.     Tenderness: There is no abdominal tenderness.  Musculoskeletal:  General: No swelling.     Cervical back: Neck supple.     Right lower leg: No edema.     Left lower leg: No edema.  Skin:    General: Skin is warm and dry.     Capillary Refill: Capillary refill takes less than 2 seconds.  Neurological:     Mental Status: She is alert.  Psychiatric:        Mood and Affect: Mood normal.     ED Results / Procedures / Treatments   Labs (all labs ordered are listed, but only abnormal results are displayed) Labs Reviewed  BASIC METABOLIC PANEL - Abnormal; Notable for the following components:      Result Value   Glucose, Bld 112 (*)    All other components within normal limits  CBC - Abnormal; Notable for the following components:   RBC  3.67 (*)    Hemoglobin 11.1 (*)    HCT 33.6 (*)    All other components within normal limits  SARS CORONAVIRUS 2 BY RT PCR  HCG, SERUM, QUALITATIVE  TROPONIN I (HIGH SENSITIVITY)  TROPONIN I (HIGH SENSITIVITY)    EKG EKG Interpretation Date/Time:  Tuesday October 23 2022 23:53:03 EDT Ventricular Rate:  77 PR Interval:  162 QRS Duration:  84 QT Interval:  378 QTC Calculation: 427 R Axis:   49  Text Interpretation: Normal sinus rhythm Low voltage QRS Borderline ECG No previous ECGs available Confirmed by Dione Booze ([id number]) on 10/24/2022 3:37:44 AM  Radiology DG Chest 2 View  Result Date: 10/24/2022 CLINICAL DATA:  Central and left-sided chest pain. EXAM: CHEST - 2 VIEW COMPARISON:  None Available. FINDINGS: The heart size and mediastinal contours are within normal limits. Both lungs are clear. The visualized skeletal structures are unremarkable. IMPRESSION: No active cardiopulmonary disease. Electronically Signed   By: Aram Candela M.D.   On: 10/24/2022 01:03    Procedures Procedures    Medications Ordered in ED Medications - No data to display  ED Course/ Medical Decision Making/ A&P             HEART Score: 0                  PERC Score: 0, PERC Score Interpretation: No need for further workup, as <2% chance of PE.  If no criteria are positive and clinicians pre-test probability is <15%, PERC Rule criteria are satisfied Medical Decision Making Amount and/or Complexity of Data Reviewed Labs: ordered. Radiology: ordered.   This patient presents to the ED for concern of chest pain, this involves an extensive number of treatment options, and is a complaint that carries with it a high risk of complications and morbidity. The emergent differential diagnosis of chest pain includes: Acute coronary syndrome, pericarditis, aortic dissection, pulmonary embolism, tension pneumothorax, and esophageal rupture.  I do not believe the patient has an emergent cause of chest pain,  other urgent/non-acute considerations include, but are not limited to: chronic angina, aortic stenosis, cardiomyopathy, myocarditis, mitral valve prolapse, pulmonary hypertension, hypertrophic obstructive cardiomyopathy (HOCM), aortic insufficiency, right ventricular hypertrophy, pneumonia, pleuritis, bronchitis, pneumothorax, tumor, gastroesophageal reflux disease (GERD), esophageal spasm, Mallory-Weiss syndrome, peptic ulcer disease, biliary disease, pancreatitis, functional gastrointestinal pain, cervical or thoracic disk disease or arthritis, shoulder arthritis, costochondritis, subacromial bursitis, anxiety or panic attack, herpes zoster, breast disorders, chest wall tumors, thoracic outlet syndrome, mediastinitis.  My initial workup includes ACS rule out  Additional history obtained from: Nursing notes from this visit. Family husband at bedside provides a  portion of the history  I ordered, reviewed and interpreted labs which include: BMP, CBC, hCG, troponin, delta troponin, COVID.  Initial and delta troponin negative.  Mild anemia with a hemoglobin of 11.1.  I ordered imaging studies including chest x-ray I independently visualized and interpreted imaging which showed negative I agree with the radiologist interpretation  Afebrile, hemodynamically stable.  32 year old female presenting for evaluation of chest pain.  This began approximately 24 hours prior to my assessment.  It is intermittent.  Not associated with activity.  No associated shortness of breath.  She appears very well on physical exam.  She does report significant increase in life stressors lately.  Initial and delta troponin negative.  EKG without acute ischemic changes.  Chest x-ray negative.  Heart score 0 and I have very low suspicion for ACS.  She is also PERC negative and I have very low suspicion for PE.  Overall suspect a component of anxiety given her increase in life stressors lately.  Patient was encouraged to follow-up  with her primary care provider in 1 week for reevaluation of her symptoms.  She was given return precautions.  Stable at discharge.  At this time there does not appear to be any evidence of an acute emergency medical condition and the patient appears stable for discharge with appropriate outpatient follow up. Diagnosis was discussed with patient who verbalizes understanding of care plan and is agreeable to discharge. I have discussed return precautions with patient and husband who verbalizes understanding. Patient encouraged to follow-up with their PCP within 1 week. All questions answered.  Note: Portions of this report may have been transcribed using voice recognition software. Every effort was made to ensure accuracy; however, inadvertent computerized transcription errors may still be present.        Final Clinical Impression(s) / ED Diagnoses Final diagnoses:  Atypical chest pain    Rx / DC Orders ED Discharge Orders     None         Michelle Piper, Cordelia Poche 10/24/22 0411    Dione Booze, MD 10/24/22 [id number]

## 2022-11-09 DIAGNOSIS — Z419 Encounter for procedure for purposes other than remedying health state, unspecified: Secondary | ICD-10-CM | POA: Diagnosis not present

## 2022-11-22 DIAGNOSIS — M79661 Pain in right lower leg: Secondary | ICD-10-CM | POA: Diagnosis not present

## 2022-11-26 DIAGNOSIS — T8484XA Pain due to internal orthopedic prosthetic devices, implants and grafts, initial encounter: Secondary | ICD-10-CM | POA: Diagnosis not present

## 2022-11-26 DIAGNOSIS — T84196A Other mechanical complication of internal fixation device of bone of right lower leg, initial encounter: Secondary | ICD-10-CM | POA: Diagnosis not present

## 2022-11-26 DIAGNOSIS — M79661 Pain in right lower leg: Secondary | ICD-10-CM | POA: Diagnosis not present

## 2022-12-09 DIAGNOSIS — Z419 Encounter for procedure for purposes other than remedying health state, unspecified: Secondary | ICD-10-CM | POA: Diagnosis not present

## 2023-01-09 DIAGNOSIS — Z419 Encounter for procedure for purposes other than remedying health state, unspecified: Secondary | ICD-10-CM | POA: Diagnosis not present

## 2023-02-09 DIAGNOSIS — Z419 Encounter for procedure for purposes other than remedying health state, unspecified: Secondary | ICD-10-CM | POA: Diagnosis not present

## 2023-03-09 DIAGNOSIS — Z419 Encounter for procedure for purposes other than remedying health state, unspecified: Secondary | ICD-10-CM | POA: Diagnosis not present

## 2023-04-20 DIAGNOSIS — Z419 Encounter for procedure for purposes other than remedying health state, unspecified: Secondary | ICD-10-CM | POA: Diagnosis not present

## 2023-05-20 DIAGNOSIS — Z419 Encounter for procedure for purposes other than remedying health state, unspecified: Secondary | ICD-10-CM | POA: Diagnosis not present

## 2023-06-20 DIAGNOSIS — Z419 Encounter for procedure for purposes other than remedying health state, unspecified: Secondary | ICD-10-CM | POA: Diagnosis not present

## 2023-07-16 DIAGNOSIS — Z8041 Family history of malignant neoplasm of ovary: Secondary | ICD-10-CM | POA: Diagnosis not present

## 2023-07-16 DIAGNOSIS — R102 Pelvic and perineal pain: Secondary | ICD-10-CM | POA: Diagnosis not present

## 2023-07-16 DIAGNOSIS — Z1331 Encounter for screening for depression: Secondary | ICD-10-CM | POA: Diagnosis not present

## 2023-07-16 DIAGNOSIS — Z01419 Encounter for gynecological examination (general) (routine) without abnormal findings: Secondary | ICD-10-CM | POA: Diagnosis not present

## 2023-07-16 DIAGNOSIS — Z30011 Encounter for initial prescription of contraceptive pills: Secondary | ICD-10-CM | POA: Diagnosis not present

## 2023-07-16 DIAGNOSIS — Z1339 Encounter for screening examination for other mental health and behavioral disorders: Secondary | ICD-10-CM | POA: Diagnosis not present

## 2023-07-20 DIAGNOSIS — Z419 Encounter for procedure for purposes other than remedying health state, unspecified: Secondary | ICD-10-CM | POA: Diagnosis not present

## 2023-08-20 DIAGNOSIS — Z419 Encounter for procedure for purposes other than remedying health state, unspecified: Secondary | ICD-10-CM | POA: Diagnosis not present

## 2023-09-20 DIAGNOSIS — Z419 Encounter for procedure for purposes other than remedying health state, unspecified: Secondary | ICD-10-CM | POA: Diagnosis not present

## 2023-10-20 DIAGNOSIS — Z419 Encounter for procedure for purposes other than remedying health state, unspecified: Secondary | ICD-10-CM | POA: Diagnosis not present

## 2023-10-22 DIAGNOSIS — J40 Bronchitis, not specified as acute or chronic: Secondary | ICD-10-CM | POA: Diagnosis not present

## 2023-10-22 DIAGNOSIS — H6691 Otitis media, unspecified, right ear: Secondary | ICD-10-CM | POA: Diagnosis not present

## 2023-10-22 DIAGNOSIS — Z03818 Encounter for observation for suspected exposure to other biological agents ruled out: Secondary | ICD-10-CM | POA: Diagnosis not present

## 2023-10-22 DIAGNOSIS — J0110 Acute frontal sinusitis, unspecified: Secondary | ICD-10-CM | POA: Diagnosis not present

## 2023-12-20 DIAGNOSIS — Z419 Encounter for procedure for purposes other than remedying health state, unspecified: Secondary | ICD-10-CM | POA: Diagnosis not present

## 2023-12-28 DIAGNOSIS — R197 Diarrhea, unspecified: Secondary | ICD-10-CM | POA: Diagnosis not present

## 2023-12-28 DIAGNOSIS — R5383 Other fatigue: Secondary | ICD-10-CM | POA: Diagnosis not present

## 2023-12-28 DIAGNOSIS — Z1152 Encounter for screening for COVID-19: Secondary | ICD-10-CM | POA: Diagnosis not present

## 2023-12-28 DIAGNOSIS — R42 Dizziness and giddiness: Secondary | ICD-10-CM | POA: Diagnosis not present

## 2023-12-30 DIAGNOSIS — R197 Diarrhea, unspecified: Secondary | ICD-10-CM | POA: Diagnosis not present
